# Patient Record
Sex: Male | Born: 1965 | Race: White | Hispanic: No | Marital: Married | Smoking: Never smoker
Health system: Southern US, Community
[De-identification: ages and names within clinical notes are randomized; demographics above are authoritative.]

## PROBLEM LIST (undated history)

## (undated) DIAGNOSIS — M1990 Unspecified osteoarthritis, unspecified site: Secondary | ICD-10-CM

## (undated) DIAGNOSIS — G4730 Sleep apnea, unspecified: Secondary | ICD-10-CM

## (undated) DIAGNOSIS — F329 Major depressive disorder, single episode, unspecified: Secondary | ICD-10-CM

## (undated) DIAGNOSIS — F419 Anxiety disorder, unspecified: Secondary | ICD-10-CM

## (undated) DIAGNOSIS — F32A Depression, unspecified: Secondary | ICD-10-CM

## (undated) DIAGNOSIS — I10 Essential (primary) hypertension: Secondary | ICD-10-CM

---
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
## 2003-08-13 HISTORY — PX: SHOULDER ARTHROSCOPY: SHX128

## 2013-08-12 HISTORY — PX: COLONOSCOPY: SHX174

## 2014-08-12 HISTORY — PX: JOINT REPLACEMENT: SHX530

## 2015-08-13 HISTORY — PX: KNEE ARTHROSCOPY WITH PATELLAR TENDON REPAIR: SHX5656

## 2019-01-21 NOTE — Patient Instructions (Addendum)
Cole Gill    Your procedure is scheduled on: 01-26-2019  Report to [facility] Main  Entrance  Report to admitting at 900 AM   [place] [facility] TEST ON__today _____ @___12 :30____, THIS TEST MUST BE DONE BEFORE SURGERY, COME TO [place].    Call this number if you have problems the morning of surgery [phone]    Remember: . BRUSH YOUR TEETH MORNING OF SURGERY AND RINSE YOUR MOUTH OUT, NO CHEWING GUM CANDY OR MINTS.   NO SOLID FOOD AFTER MIDNIGHT THE NIGHT PRIOR TO SURGERY. NOTHING BY MOUTH EXCEPT CLEAR LIQUIDS UNTIL 430 AM.. PLEASE FINISH ENSURE DRINK PER SURGEON ORDER  WHICH NEEDS TO BE COMPLETED AT 430 AM.   CLEAR LIQUID DIET   Foods Allowed                                                                     Foods Excluded  Coffee and tea, regular and decaf                             liquids that you cannot  Plain Jell-O in any flavor                                             see through such as: Fruit ices (not with fruit pulp)                                     milk, soups, orange juice  Iced Popsicles                                    All solid food Carbonated beverages, regular and diet                                    Cranberry, grape and apple juices Sports drinks like Gatorade Lightly seasoned clear broth or consume(fat free) Sugar, honey syrup  Sample Menu Breakfast                                Lunch                                     Supper Cranberry juice                    Beef broth                            Chicken broth Jell-O  Grape juice                           Apple juice Coffee or tea                        Jell-O                                      Popsicle                                                Coffee or tea                        Coffee or  tea  _____________________________________________________________________    Take these medicines the morning of surgery with A SIP OF WATER:  DO NOT TAKE ANY DIABETIC MEDICATIONS DAY OF YOUR SURGERY                               You may not have any metal on your body including hair pins and              piercings  Do not wear jewelry, make-up, lotions, powders or perfumes, deodorant             Do not wear nail polish.  Do not shave  48 hours prior to surgery.              Men may shave face and neck.   Do not bring valuables to the hospital. [facility] IS NOT             RESPONSIBLE   FOR VALUABLES.  Contacts, dentures or bridgework may not be worn into surgery.  Leave suitcase in the car. After surgery it may be brought to your room.                  Please read over the following fact sheets you were given: _____________________________________________________________________             [facility] Before surgery, you can play an important role.  Because skin is not sterile, your skin needs to be as free of germs as possible.  You can reduce the number of germs on your skin by washing with CHG (chlorahexidine gluconate) soap before surgery.  CHG is an antiseptic cleaner which kills germs and bonds with the skin to continue killing germs even after washing. Please DO NOT use if you have an allergy to CHG or antibacterial soaps.  If your skin becomes reddened/irritated stop using the CHG and inform your nurse when you arrive at Short Stay. Do not shave (including legs and underarms) for at least 48 hours prior to the first CHG shower.  You may shave your face/neck. Please follow these instructions carefully:  1.  Shower with CHG Soap the night before surgery and the  morning of Surgery.  2.  If you choose to wash your hair, wash your hair first as usual with your  normal  shampoo.  3.  After you shampoo, rinse your hair and body thoroughly to remove the  shampoo.                           4.  Use CHG as you would any other liquid soap.  You can apply chg directly  to the skin and wash                       Gently with a scrungie or clean washcloth.  5.  Apply the CHG Soap to your body ONLY FROM THE NECK DOWN.   Do not use on face/ open                           Wound or open sores. Avoid contact with eyes, ears mouth and genitals (private parts).                       Wash face,  Genitals (private parts) with your normal soap.             6.  Wash thoroughly, paying special attention to the area where your surgery  will be performed.  7.  Thoroughly rinse your body with warm water from the neck down.  8.  DO NOT shower/wash with your normal soap after using and rinsing off  the CHG Soap.                9.  Pat yourself dry with a clean towel.            10.  Wear clean pajamas.            11.  Place clean sheets on your bed the night of your first shower and do not  sleep with pets. Day of Surgery : Do not apply any lotions/deodorants the morning of surgery.  Please wear clean clothes to the hospital/surgery center.  FAILURE TO FOLLOW THESE INSTRUCTIONS MAY RESULT IN THE CANCELLATION OF YOUR SURGERY PATIENT SIGNATURE_________________________________  NURSE SIGNATURE__________________________________  ________________________________________________________________________   Adam Phenix  An incentive spirometer is a tool that can help keep your lungs clear and active. This tool measures how well you are filling your lungs with each breath. Taking long deep breaths may help reverse or decrease the chance of developing breathing (pulmonary) problems (especially infection) following:  A long period of time when you are unable to move or be active. BEFORE THE PROCEDURE   If the spirometer includes an indicator to show your best effort, your nurse or respiratory therapist will set it to a desired goal.  If possible, sit up straight  or lean slightly forward. Try not to slouch.  Hold the incentive spirometer in an upright position. INSTRUCTIONS FOR USE  1. Sit on the edge of your bed if possible, or sit up as far as you can in bed or on a chair. 2. Hold the incentive spirometer in an upright position. 3. Breathe out normally. 4. Place the mouthpiece in your mouth and seal your lips tightly around it. 5. Breathe in slowly and as deeply as possible, raising the piston or the ball toward the top of the column. 6. Hold your breath for 3-5 seconds or for as long as possible. Allow the piston or ball to fall to the bottom of the column. 7. Remove the mouthpiece from your mouth and breathe out normally. 8. Rest for a few seconds and repeat Steps 1 through 7 at least 10 times every  1-2 hours when you are awake. Take your time and take a few normal breaths between deep breaths. 9. The spirometer may include an indicator to show your best effort. Use the indicator as a goal to work toward during each repetition. 10. After each set of 10 deep breaths, practice coughing to be sure your lungs are clear. If you have an incision (the cut made at the time of surgery), support your incision when coughing by placing a pillow or rolled up towels firmly against it. Once you are able to get out of bed, walk around indoors and cough well. You may stop using the incentive spirometer when instructed by your caregiver.  RISKS AND COMPLICATIONS  Take your time so you do not get dizzy or light-headed.  If you are in pain, you may need to take or ask for pain medication before doing incentive spirometry. It is harder to take a deep breath if you are having pain. AFTER USE  Rest and breathe slowly and easily.  It can be helpful to keep track of a log of your progress. Your caregiver can provide you with a simple table to help with this. If you are using the spirometer at home, follow these instructions: [place] IF:   You are having  difficultly using the spirometer.  You have trouble using the spirometer as often as instructed.  Your pain medication is not giving enough relief while using the spirometer.  You develop fever of 100.5 F (38.1 C) or higher. SEEK IMMEDIATE MEDICAL CARE IF:   You cough up bloody sputum that had not been present before.  You develop fever of 102 F (38.9 C) or greater.  You develop worsening pain at or near the incision site. MAKE SURE YOU:   Understand these instructions.  Will watch your condition.  Will get help right away if you are not doing well or get worse. Document Released: 12/09/2006 Document Revised: 10/21/2011 Document Reviewed: 02/09/2007 ExitCare Patient Information 2014 ExitCare, [place].   ________________________________________________________________________  WHAT IS A BLOOD TRANSFUSION? Blood Transfusion Information  A transfusion is the replacement of blood or some of its parts. Blood is made up of multiple cells which provide different functions.  Red blood cells carry oxygen and are used for blood loss replacement.  White blood cells fight against infection.  Platelets control bleeding.  Plasma helps clot blood.  Other blood products are available for specialized needs, such as hemophilia or other clotting disorders. BEFORE THE TRANSFUSION  Who gives blood for transfusions?   Healthy volunteers who are fully evaluated to make sure their blood is safe. This is blood bank blood. Transfusion therapy is the safest it has ever been in the practice of medicine. Before blood is taken from a donor, a complete history is taken to make sure that person has no history of diseases nor engages in risky social behavior (examples are intravenous drug use or sexual activity with multiple partners). The donor's travel history is screened to minimize risk of transmitting infections, such as malaria. The donated blood is tested for signs of infectious diseases, such as  HIV and hepatitis. The blood is then tested to be sure it is compatible with you in order to minimize the chance of a transfusion reaction. If you or a relative donates blood, this is often done in anticipation of surgery and is not appropriate for emergency situations. It takes many days to process the donated blood. RISKS AND COMPLICATIONS Although transfusion therapy is very safe  and saves many lives, the main dangers of transfusion include:   Getting an infectious disease.  Developing a transfusion reaction. This is an allergic reaction to something in the blood you were given. Every precaution is taken to prevent this. The decision to have a blood transfusion has been considered carefully by your caregiver before blood is given. Blood is not given unless the benefits outweigh the risks. AFTER THE TRANSFUSION  Right after receiving a blood transfusion, you will usually feel much better and more energetic. This is especially true if your red blood cells have gotten low (anemic). The transfusion raises the level of the red blood cells which carry oxygen, and this usually causes an energy increase.  The nurse administering the transfusion will monitor you carefully for complications. HOME CARE INSTRUCTIONS  No special instructions are needed after a transfusion. You may find your energy is better. Speak with your caregiver about any limitations on activity for underlying diseases you may have. SEEK MEDICAL CARE IF:   Your condition is not improving after your transfusion.  You develop redness or irritation at the intravenous (IV) site. SEEK IMMEDIATE MEDICAL CARE IF:  Any of the following symptoms occur over the next 12 hours:  Shaking chills.  You have a temperature by mouth above 102 F (38.9 C), not controlled by medicine.  Chest, back, or muscle pain.  People around you feel you are not acting correctly or are confused.  Shortness of breath or difficulty breathing.  Dizziness  and fainting.  You get a rash or develop hives.  You have a decrease in urine output.  Your urine turns a dark color or changes to pink, red, or brown. Any of the following symptoms occur over the next 10 days:  You have a temperature by mouth above 102 F (38.9 C), not controlled by medicine.  Shortness of breath.  Weakness after normal activity.  The white part of the eye turns yellow (jaundice).  You have a decrease in the amount of urine or are urinating less often.  Your urine turns a dark color or changes to pink, red, or brown. Document Released: 07/26/2000 Document Revised: 10/21/2011 Document Reviewed: 03/14/2008 [facility] Patient Information 2014 [place], [place].  _______________________________________________________________________

## 2019-01-22 ENCOUNTER — Encounter (HOSPITAL_COMMUNITY)
Admission: RE | Admit: 2019-01-22 | Discharge: 2019-01-22 | Disposition: A | Discharge status: Home / Self Care | Payer: BC Managed Care – PPO | Source: Ambulatory Visit | Attending: Orthopedic Surgery | Admitting: Orthopedic Surgery

## 2019-01-22 ENCOUNTER — Encounter (HOSPITAL_COMMUNITY): Payer: Self-pay

## 2019-01-22 ENCOUNTER — Other Ambulatory Visit: Payer: Self-pay

## 2019-01-22 ENCOUNTER — Other Ambulatory Visit (HOSPITAL_COMMUNITY)
Admission: RE | Admit: 2019-01-22 | Discharge: 2019-01-22 | Disposition: A | Discharge status: Home / Self Care | Payer: BC Managed Care – PPO | Source: Ambulatory Visit | Attending: Orthopedic Surgery | Admitting: Orthopedic Surgery

## 2019-01-22 DIAGNOSIS — Z1159 Encounter for screening for other viral diseases: Secondary | ICD-10-CM | POA: Insufficient documentation

## 2019-01-22 DIAGNOSIS — M1612 Unilateral primary osteoarthritis, left hip: Secondary | ICD-10-CM | POA: Insufficient documentation

## 2019-01-22 DIAGNOSIS — I517 Cardiomegaly: Secondary | ICD-10-CM | POA: Diagnosis not present

## 2019-01-22 DIAGNOSIS — Z01818 Encounter for other preprocedural examination: Secondary | ICD-10-CM | POA: Diagnosis present

## 2019-01-22 HISTORY — DX: Sleep apnea, unspecified: G47.30

## 2019-01-22 HISTORY — DX: Depression, unspecified: F32.A

## 2019-01-22 HISTORY — DX: Essential (primary) hypertension: I10

## 2019-01-22 HISTORY — DX: Anxiety disorder, unspecified: F41.9

## 2019-01-22 HISTORY — DX: Unspecified osteoarthritis, unspecified site: M19.90

## 2019-01-22 LAB — CBC
HCT: 54.3 % — ABNORMAL HIGH (ref 39.0–52.0)
Hemoglobin: 18.3 g/dL — ABNORMAL HIGH (ref 13.0–17.0)
MCH: 32.2 pg (ref 26.0–34.0)
MCHC: 33.7 g/dL (ref 30.0–36.0)
MCV: 95.4 fL (ref 80.0–100.0)
Platelets: 214 10*3/uL (ref 150–400)
RBC: 5.69 MIL/uL (ref 4.22–5.81)
RDW: 12.7 % (ref 11.5–15.5)
WBC: 7 10*3/uL (ref 4.0–10.5)
nRBC: 0 % (ref 0.0–0.2)

## 2019-01-22 LAB — BASIC METABOLIC PANEL
Anion gap: 9 (ref 5–15)
BUN: 19 mg/dL (ref 6–20)
CO2: 29 mmol/L (ref 22–32)
Calcium: 9.2 mg/dL (ref 8.9–10.3)
Chloride: 102 mmol/L (ref 98–111)
Creatinine, Ser: 1.06 mg/dL (ref 0.61–1.24)
GFR calc Af Amer: >60 mL/min (ref >60)
GFR calc non Af Amer: >60 mL/min (ref >60)
Glucose, Bld: 94 mg/dL (ref 70–99)
Potassium: 4 mmol/L (ref 3.5–5.1)
Sodium: 140 mmol/L (ref 135–145)

## 2019-01-22 LAB — SURGICAL PCR SCREEN
MRSA, PCR: POSITIVE — AB
Staphylococcus aureus: POSITIVE — AB

## 2019-01-22 LAB — ABO/RH: ABO/RH(D): O POS

## 2019-01-22 NOTE — Progress Notes (Signed)
Konrad Felix PA  Pt has stopped ASA 01/11/19 Per Dr. Alvan Dame. His PCP is with [facility] in [place] ,[place]

## 2019-01-23 LAB — NOVEL CORONAVIRUS, NAA (HOSP ORDER, SEND-OUT TO REF LAB; TAT 18-24 HRS): SARS-CoV-2, NAA: NOT DETECTED

## 2019-01-25 ENCOUNTER — Encounter (HOSPITAL_COMMUNITY): Payer: Self-pay | Admitting: *Deleted

## 2019-01-25 MED ORDER — VANCOMYCIN HCL 10 G IV SOLR
1500.0000 mg | INTRAVENOUS | Status: AC
  Administered 2019-01-26: 1500 mg via INTRAVENOUS
  Filled 2019-01-25: qty 1500

## 2019-01-25 NOTE — Progress Notes (Signed)
    SPOKE W/  Patient via phone     SCREENING SYMPTOMS OF COVID 19:   COUGH--no  RUNNY NOSE--- no  SORE THROAT---no  NASAL CONGESTION----no  SNEEZING----no  SHORTNESS OF BREATH---no  DIFFICULTY BREATHING---no  TEMP >100.0 -----no  UNEXPLAINED BODY ACHES------no  CHILLS -------- no  HEADACHES ---------no  LOSS OF SMELL/ TASTE --------no    HAVE YOU OR ANY FAMILY MEMBER TRAVELLED PAST 14 DAYS OUT OF THE   COUNTY---yes lives in [place]. Just between there and [place] STATE----no COUNTRY----no  HAVE YOU OR ANY FAMILY MEMBER BEEN EXPOSED TO ANYONE WITH COVID 19? no

## 2019-01-25 NOTE — H&P (Signed)
TOTAL HIP ADMISSION H&P  Patient is admitted for left total hip arthroplasty, anterior approach.  Subjective:  Chief Complaint:   Left hip primary OA / pain  HPI: Cole Gill, 53 y.o. male, has a history of pain and functional disability in the left hip(s) due to arthritis and patient has failed non-surgical conservative treatments for greater than 12 weeks to include NSAID's and/or analgesics and activity modification.  Onset of symptoms was gradual starting 1+ years ago with gradually worsening course since that time.The patient noted no past surgery on the left hip(s).  Patient currently rates pain in the left hip at 5 out of 10 with activity. Patient has night pain, worsening of pain with activity and weight bearing, trendelenberg gait, pain that interfers with activities of daily living and pain with passive range of motion. Patient has evidence of periarticular osteophytes and joint space narrowing by imaging studies. This condition presents safety issues increasing the risk of falls.   There is no current active infection.Risks, benefits and expectations were discussed with the patient.  Risks including but not limited to the risk of anesthesia, blood clots, nerve damage, blood vessel damage, failure of the prosthesis, infection and up to and including death.  Patient understand the risks, benefits and expectations and wishes to proceed with surgery.   PCP: System, Pcp Not In  D/C Plans:       Home   Post-op Meds:       No Rx given   Tranexamic Acid:      To be given - IV   Decadron:      Is to be given  FYI:      ASA  Norco  CPAP  DME:   Pt already has equipment  PT:   [facility]    Past Medical History:  Diagnosis Date  . Anxiety   . Arthritis    hips knees fingers  . Depression   . Hypertension    Dr. Georgina PillionMassey  . Sleep apnea    C-pap    Past Surgical History:  Procedure Laterality Date  . COLONOSCOPY  2015  . JOINT REPLACEMENT  2016  . KNEE ARTHROSCOPY WITH PATELLAR  TENDON REPAIR Right 2017  . SHOULDER ARTHROSCOPY Left 2005    Current Facility-Administered Medications  Medication Dose Route Frequency Provider Last Rate Last Dose  . [START ON 01/26/2019] vancomycin (VANCOCIN) 1,500 mg in sodium chloride 0.9 % 500 mL IVPB  1,500 mg Intravenous On Call to OR Durene Romanslin, Laloni Rowton, MD       Current Outpatient Medications  Medication Sig Dispense Refill Last Dose  . acetaminophen (TYLENOL) 500 MG tablet Take 1,000 mg by mouth every 6 (six) hours as needed for moderate pain.     [name]. amLODIPine-Valsartan-HCTZ 10-320-25 MG TABS Take 1 tablet by mouth daily.     [name]. aspirin 81 MG EC tablet Take 81 mg by mouth daily.     . celecoxib (CELEBREX) 200 MG capsule Take 200 mg by mouth daily.     . fluticasone (FLONASE) 50 MCG/ACT nasal spray Place 2 sprays into both nostrils 2 (two) times a day.     . Magnesium 400 MG TABS Take 800 mg by mouth at bedtime.     . Potassium 99 MG TABS Take 99 mg by mouth daily.     . tamsulosin (FLOMAX) 0.4 MG CAPS capsule Take 0.4 mg by mouth daily.     [name]. testosterone cypionate (DEPOTESTOSTERONE CYPIONATE) 200 MG/ML injection Inject 200 mg into the muscle  once a week.     . Vilazodone HCl (VIIBRYD) 20 MG TABS Take 20 mg by mouth daily.      No Known Allergies   Social History   Tobacco Use  . Smoking status: Never Smoker  . Smokeless tobacco: Former Systems developer    Types: Chew  Substance Use Topics  . Alcohol use: Not Currently    Frequency: Never    Comment: rare       Review of Systems  Constitutional: Negative.   HENT: Negative.   Eyes: Negative.   Respiratory: Negative.   Cardiovascular: Negative.   Gastrointestinal: Negative.   Genitourinary: Negative.   Musculoskeletal: Positive for joint pain.  Skin: Negative.   Neurological: Negative.   Endo/Heme/Allergies: Negative.   Psychiatric/Behavioral: Negative.     Objective:  Physical Exam  Constitutional: He is oriented to person, place, and time. He appears well-developed.   HENT:  Head: Normocephalic.  Eyes: Pupils are equal, round, and reactive to light.  Neck: Neck supple. No JVD present. No tracheal deviation present. No thyromegaly present.  Cardiovascular: Normal rate, regular rhythm and intact distal pulses.  Respiratory: Effort normal and breath sounds normal. No respiratory distress. He has no wheezes.  GI: Soft. There is no abdominal tenderness. There is no guarding.  Musculoskeletal:     Left hip: He exhibits decreased range of motion, decreased strength, tenderness and bony tenderness. He exhibits no swelling, no deformity and no laceration.  Lymphadenopathy:    He has no cervical adenopathy.  Neurological: He is alert and oriented to person, place, and time.  Skin: Skin is warm and dry.  Psychiatric: He has a normal mood and affect.     Labs:   Estimated body mass index is 33.19 kg/m as calculated from the following:   Height as of 01/22/19: 5\' 11"  (1.803 m).   Weight as of 01/22/19: 108 kg.   Imaging Review Plain radiographs demonstrate severe degenerative joint disease of the left hip(s). The bone quality appears to be good for age and reported activity level.      Assessment/Plan:  End stage arthritis, left hip(s)  The patient history, physical examination, clinical judgement of the provider and imaging studies are consistent with end stage degenerative joint disease of the left hip(s) and total hip arthroplasty is deemed medically necessary. The treatment options including medical management, injection therapy, arthroscopy and arthroplasty were discussed at length. The risks and benefits of total hip arthroplasty were presented and reviewed. The risks due to aseptic loosening, infection, stiffness, dislocation/subluxation,  thromboembolic complications and other imponderables were discussed.  The patient acknowledged the explanation, agreed to proceed with the plan and consent was signed. Patient is being admitted for inpatient  treatment for surgery, pain control, PT, OT, prophylactic antibiotics, VTE prophylaxis, progressive ambulation and ADL's and discharge planning.The patient is planning to be discharged home.    West Pugh Yola Paradiso   PA-C  01/25/2019, 11:02 AM

## 2019-01-26 ENCOUNTER — Inpatient Hospital Stay (HOSPITAL_COMMUNITY): Payer: BC Managed Care – PPO

## 2019-01-26 ENCOUNTER — Inpatient Hospital Stay (HOSPITAL_COMMUNITY): Payer: BC Managed Care – PPO | Admitting: Physician Assistant

## 2019-01-26 ENCOUNTER — Inpatient Hospital Stay (HOSPITAL_COMMUNITY)
Admission: RE | Admit: 2019-01-26 | Discharge: 2019-01-27 | DRG: 470 | Disposition: A | Discharge status: Home / Self Care | Payer: BC Managed Care – PPO | Attending: Orthopedic Surgery | Admitting: Orthopedic Surgery

## 2019-01-26 ENCOUNTER — Other Ambulatory Visit: Payer: Self-pay

## 2019-01-26 ENCOUNTER — Telehealth (HOSPITAL_COMMUNITY): Payer: Self-pay | Admitting: *Deleted

## 2019-01-26 ENCOUNTER — Encounter (HOSPITAL_COMMUNITY): Payer: Self-pay | Admitting: Anesthesiology

## 2019-01-26 ENCOUNTER — Inpatient Hospital Stay (HOSPITAL_COMMUNITY): Payer: BC Managed Care – PPO | Admitting: Anesthesiology

## 2019-01-26 ENCOUNTER — Observation Stay (HOSPITAL_COMMUNITY): Payer: BC Managed Care – PPO

## 2019-01-26 ENCOUNTER — Encounter (HOSPITAL_COMMUNITY)
Admission: RE | Disposition: A | Discharge status: Home / Self Care | Payer: Self-pay | Source: Home / Self Care | Attending: Orthopedic Surgery

## 2019-01-26 DIAGNOSIS — Z96649 Presence of unspecified artificial hip joint: Secondary | ICD-10-CM

## 2019-01-26 DIAGNOSIS — Z79899 Other long term (current) drug therapy: Secondary | ICD-10-CM

## 2019-01-26 DIAGNOSIS — M1612 Unilateral primary osteoarthritis, left hip: Principal | ICD-10-CM | POA: Diagnosis present

## 2019-01-26 DIAGNOSIS — I10 Essential (primary) hypertension: Secondary | ICD-10-CM | POA: Diagnosis present

## 2019-01-26 DIAGNOSIS — Z6833 Body mass index (BMI) 33.0-33.9, adult: Secondary | ICD-10-CM

## 2019-01-26 DIAGNOSIS — Z7982 Long term (current) use of aspirin: Secondary | ICD-10-CM

## 2019-01-26 DIAGNOSIS — G4730 Sleep apnea, unspecified: Secondary | ICD-10-CM | POA: Diagnosis present

## 2019-01-26 DIAGNOSIS — E669 Obesity, unspecified: Secondary | ICD-10-CM | POA: Diagnosis present

## 2019-01-26 DIAGNOSIS — M170 Bilateral primary osteoarthritis of knee: Secondary | ICD-10-CM | POA: Diagnosis present

## 2019-01-26 DIAGNOSIS — Z96642 Presence of left artificial hip joint: Secondary | ICD-10-CM

## 2019-01-26 DIAGNOSIS — M19049 Primary osteoarthritis, unspecified hand: Secondary | ICD-10-CM | POA: Diagnosis present

## 2019-01-26 DIAGNOSIS — Z419 Encounter for procedure for purposes other than remedying health state, unspecified: Secondary | ICD-10-CM

## 2019-01-26 HISTORY — PX: TOTAL HIP ARTHROPLASTY: SHX124

## 2019-01-26 LAB — TYPE AND SCREEN
ABO/RH(D): O POS
Antibody Screen: NEGATIVE

## 2019-01-26 SURGERY — ARTHROPLASTY, HIP, TOTAL, ANTERIOR APPROACH
Anesthesia: Spinal | Site: Hip | Laterality: Left

## 2019-01-26 MED ORDER — DEXAMETHASONE SODIUM PHOSPHATE 10 MG/ML IJ SOLN: 10.0000 mg | Freq: Once | INTRAMUSCULAR | Status: DC

## 2019-01-26 MED ORDER — PROPOFOL 10 MG/ML IV BOLUS
INTRAVENOUS | Status: AC
  Filled 2019-01-26: qty 20

## 2019-01-26 MED ORDER — PROPOFOL 10 MG/ML IV BOLUS
INTRAVENOUS | Status: AC
  Filled 2019-01-26: qty 60

## 2019-01-26 MED ORDER — MENTHOL 3 MG MT LOZG: 1.0000 | LOZENGE | OROMUCOSAL | Status: DC | PRN

## 2019-01-26 MED ORDER — EPHEDRINE 5 MG/ML INJ
INTRAVENOUS | Status: AC
  Filled 2019-01-26: qty 10

## 2019-01-26 MED ORDER — MEPERIDINE HCL 50 MG/ML IJ SOLN: 6.2500 mg | INTRAMUSCULAR | Status: DC | PRN

## 2019-01-26 MED ORDER — CEFAZOLIN SODIUM-DEXTROSE 2-4 GM/100ML-% IV SOLN
2.0000 g | INTRAVENOUS | Status: AC
  Administered 2019-01-26: 2 g via INTRAVENOUS
  Filled 2019-01-26: qty 100

## 2019-01-26 MED ORDER — HYDROCHLOROTHIAZIDE 25 MG PO TABS
25.0000 mg | ORAL_TABLET | Freq: Every day | ORAL | Status: DC
  Administered 2019-01-27: 25 mg via ORAL
  Filled 2019-01-26: qty 1

## 2019-01-26 MED ORDER — OXYCODONE HCL 5 MG/5ML PO SOLN: 5.0000 mg | Freq: Once | ORAL | Status: DC | PRN

## 2019-01-26 MED ORDER — ONDANSETRON HCL 4 MG/2ML IJ SOLN: 4.0000 mg | Freq: Once | INTRAMUSCULAR | Status: DC | PRN

## 2019-01-26 MED ORDER — HYDROCODONE-ACETAMINOPHEN 7.5-325 MG PO TABS: 1.0000 | ORAL_TABLET | ORAL | Status: DC | PRN

## 2019-01-26 MED ORDER — TAMSULOSIN HCL 0.4 MG PO CAPS
0.4000 mg | ORAL_CAPSULE | Freq: Every day | ORAL | Status: DC
  Administered 2019-01-27: 0.4 mg via ORAL
  Filled 2019-01-26: qty 1

## 2019-01-26 MED ORDER — CEFAZOLIN SODIUM-DEXTROSE 2-4 GM/100ML-% IV SOLN
2.0000 g | Freq: Four times a day (QID) | INTRAVENOUS | Status: AC
  Administered 2019-01-26 – 2019-01-27 (×2): 2 g via INTRAVENOUS
  Filled 2019-01-26 (×2): qty 100

## 2019-01-26 MED ORDER — HYDROMORPHONE HCL 1 MG/ML IJ SOLN: 0.5000 mg | INTRAMUSCULAR | Status: DC | PRN

## 2019-01-26 MED ORDER — PHENOL 1.4 % MT LIQD: 1.0000 | OROMUCOSAL | Status: DC | PRN

## 2019-01-26 MED ORDER — ASPIRIN 81 MG PO CHEW
81.0000 mg | CHEWABLE_TABLET | Freq: Two times a day (BID) | ORAL | Status: DC
  Administered 2019-01-26 – 2019-01-27 (×2): 81 mg via ORAL
  Filled 2019-01-26 (×2): qty 1

## 2019-01-26 MED ORDER — MIDAZOLAM HCL 5 MG/5ML IJ SOLN
INTRAMUSCULAR | Status: DC | PRN
  Administered 2019-01-26: 2 mg via INTRAVENOUS

## 2019-01-26 MED ORDER — AMLODIPINE BESYLATE 10 MG PO TABS
10.0000 mg | ORAL_TABLET | Freq: Every day | ORAL | Status: DC
  Administered 2019-01-27: 10 mg via ORAL
  Filled 2019-01-26: qty 1

## 2019-01-26 MED ORDER — BUPIVACAINE IN DEXTROSE 0.75-8.25 % IT SOLN
INTRATHECAL | Status: DC | PRN
  Administered 2019-01-26: 2 mL via INTRATHECAL

## 2019-01-26 MED ORDER — FLUTICASONE PROPIONATE 50 MCG/ACT NA SUSP
2.0000 | Freq: Two times a day (BID) | NASAL | Status: DC
  Filled 2019-01-26: qty 16

## 2019-01-26 MED ORDER — METHOCARBAMOL 500 MG IVPB - SIMPLE MED
500.0000 mg | Freq: Four times a day (QID) | INTRAVENOUS | Status: DC | PRN
  Filled 2019-01-26: qty 50

## 2019-01-26 MED ORDER — SODIUM CHLORIDE 0.9 % IV SOLN
INTRAVENOUS | Status: DC
  Administered 2019-01-26 – 2019-01-27 (×2): via INTRAVENOUS

## 2019-01-26 MED ORDER — EPHEDRINE SULFATE-NACL 50-0.9 MG/10ML-% IV SOSY
PREFILLED_SYRINGE | INTRAVENOUS | Status: DC | PRN
  Administered 2019-01-26 (×2): 10 mg via INTRAVENOUS

## 2019-01-26 MED ORDER — DOCUSATE SODIUM 100 MG PO CAPS
100.0000 mg | ORAL_CAPSULE | Freq: Two times a day (BID) | ORAL | Status: DC
  Administered 2019-01-26 – 2019-01-27 (×2): 100 mg via ORAL
  Filled 2019-01-26 (×2): qty 1

## 2019-01-26 MED ORDER — POLYETHYLENE GLYCOL 3350 17 G PO PACK
17.0000 g | PACK | Freq: Two times a day (BID) | ORAL | Status: DC
  Administered 2019-01-26 – 2019-01-27 (×2): 17 g via ORAL
  Filled 2019-01-26 (×2): qty 1

## 2019-01-26 MED ORDER — FENTANYL CITRATE (PF) 100 MCG/2ML IJ SOLN
INTRAMUSCULAR | Status: DC | PRN
  Administered 2019-01-26: 25 ug via INTRAVENOUS
  Administered 2019-01-26: 75 ug via INTRAVENOUS

## 2019-01-26 MED ORDER — METOCLOPRAMIDE HCL 5 MG/ML IJ SOLN: 5.0000 mg | Freq: Three times a day (TID) | INTRAMUSCULAR | Status: DC | PRN

## 2019-01-26 MED ORDER — BISACODYL 10 MG RE SUPP: 10.0000 mg | Freq: Every day | RECTAL | Status: DC | PRN

## 2019-01-26 MED ORDER — PROPOFOL 500 MG/50ML IV EMUL
INTRAVENOUS | Status: DC | PRN
  Administered 2019-01-26: 100 ug/kg/min via INTRAVENOUS

## 2019-01-26 MED ORDER — TRANEXAMIC ACID-NACL 1000-0.7 MG/100ML-% IV SOLN
1000.0000 mg | Freq: Once | INTRAVENOUS | Status: AC
  Administered 2019-01-26: 1000 mg via INTRAVENOUS
  Filled 2019-01-26: qty 100

## 2019-01-26 MED ORDER — MAGNESIUM CITRATE PO SOLN: 1.0000 | Freq: Once | ORAL | Status: DC | PRN

## 2019-01-26 MED ORDER — DIPHENHYDRAMINE HCL 12.5 MG/5ML PO ELIX: 12.5000 mg | ORAL_SOLUTION | ORAL | Status: DC | PRN

## 2019-01-26 MED ORDER — ALUM & MAG HYDROXIDE-SIMETH 200-200-20 MG/5ML PO SUSP: 15.0000 mL | ORAL | Status: DC | PRN

## 2019-01-26 MED ORDER — ALBUMIN HUMAN 5 % IV SOLN
INTRAVENOUS | Status: AC
  Filled 2019-01-26: qty 500

## 2019-01-26 MED ORDER — LACTATED RINGERS IV SOLN
INTRAVENOUS | Status: DC
  Administered 2019-01-26 (×3): via INTRAVENOUS

## 2019-01-26 MED ORDER — ONDANSETRON HCL 4 MG PO TABS: 4.0000 mg | ORAL_TABLET | Freq: Four times a day (QID) | ORAL | Status: DC | PRN

## 2019-01-26 MED ORDER — FENTANYL CITRATE (PF) 100 MCG/2ML IJ SOLN
INTRAMUSCULAR | Status: AC
  Filled 2019-01-26: qty 2

## 2019-01-26 MED ORDER — MIDAZOLAM HCL 2 MG/2ML IJ SOLN
INTRAMUSCULAR | Status: AC
  Filled 2019-01-26: qty 2

## 2019-01-26 MED ORDER — TRANEXAMIC ACID-NACL 1000-0.7 MG/100ML-% IV SOLN
1000.0000 mg | INTRAVENOUS | Status: AC
  Administered 2019-01-26: 1000 mg via INTRAVENOUS
  Filled 2019-01-26: qty 100

## 2019-01-26 MED ORDER — ONDANSETRON HCL 4 MG/2ML IJ SOLN: 4.0000 mg | Freq: Four times a day (QID) | INTRAMUSCULAR | Status: DC | PRN

## 2019-01-26 MED ORDER — ONDANSETRON HCL 4 MG/2ML IJ SOLN
INTRAMUSCULAR | Status: DC | PRN
  Administered 2019-01-26: 4 mg via INTRAVENOUS

## 2019-01-26 MED ORDER — ALBUMIN HUMAN 5 % IV SOLN
INTRAVENOUS | Status: DC | PRN
  Administered 2019-01-26 (×2): via INTRAVENOUS

## 2019-01-26 MED ORDER — ACETAMINOPHEN 160 MG/5ML PO SOLN: 325.0000 mg | ORAL | Status: DC | PRN

## 2019-01-26 MED ORDER — DEXAMETHASONE SODIUM PHOSPHATE 10 MG/ML IJ SOLN
10.0000 mg | Freq: Once | INTRAMUSCULAR | Status: AC
  Administered 2019-01-26: 10 mg via INTRAVENOUS

## 2019-01-26 MED ORDER — PHENYLEPHRINE HCL (PRESSORS) 10 MG/ML IV SOLN
INTRAVENOUS | Status: AC
  Filled 2019-01-26: qty 1

## 2019-01-26 MED ORDER — PHENYLEPHRINE 40 MCG/ML (10ML) SYRINGE FOR IV PUSH (FOR BLOOD PRESSURE SUPPORT)
PREFILLED_SYRINGE | INTRAVENOUS | Status: DC | PRN
  Administered 2019-01-26: 80 ug via INTRAVENOUS

## 2019-01-26 MED ORDER — METHOCARBAMOL 500 MG PO TABS
500.0000 mg | ORAL_TABLET | Freq: Four times a day (QID) | ORAL | Status: DC | PRN
  Administered 2019-01-26 – 2019-01-27 (×2): 500 mg via ORAL
  Filled 2019-01-26 (×2): qty 1

## 2019-01-26 MED ORDER — PROPOFOL 10 MG/ML IV BOLUS
INTRAVENOUS | Status: DC | PRN
  Administered 2019-01-26 (×2): 10 mg via INTRAVENOUS

## 2019-01-26 MED ORDER — ACETAMINOPHEN 325 MG PO TABS: 325.0000 mg | ORAL_TABLET | Freq: Four times a day (QID) | ORAL | Status: DC | PRN

## 2019-01-26 MED ORDER — CHLORHEXIDINE GLUCONATE 4 % EX LIQD
60.0000 mL | Freq: Once | CUTANEOUS | Status: AC
  Administered 2019-01-26: 4 via TOPICAL

## 2019-01-26 MED ORDER — AMLODIPINE-VALSARTAN-HCTZ 10-320-25 MG PO TABS: 1.0000 | ORAL_TABLET | Freq: Every day | ORAL | Status: DC

## 2019-01-26 MED ORDER — FENTANYL CITRATE (PF) 100 MCG/2ML IJ SOLN: 25.0000 ug | INTRAMUSCULAR | Status: DC | PRN

## 2019-01-26 MED ORDER — CELECOXIB 200 MG PO CAPS
200.0000 mg | ORAL_CAPSULE | Freq: Two times a day (BID) | ORAL | Status: DC
  Administered 2019-01-26 – 2019-01-27 (×2): 200 mg via ORAL
  Filled 2019-01-26 (×2): qty 1

## 2019-01-26 MED ORDER — VILAZODONE HCL 20 MG PO TABS
20.0000 mg | ORAL_TABLET | Freq: Every day | ORAL | Status: DC
  Filled 2019-01-26: qty 1

## 2019-01-26 MED ORDER — OXYCODONE HCL 5 MG PO TABS: 5.0000 mg | ORAL_TABLET | Freq: Once | ORAL | Status: DC | PRN

## 2019-01-26 MED ORDER — FERROUS SULFATE 325 (65 FE) MG PO TABS
325.0000 mg | ORAL_TABLET | Freq: Three times a day (TID) | ORAL | Status: DC
  Administered 2019-01-26 – 2019-01-27 (×2): 325 mg via ORAL
  Filled 2019-01-26 (×2): qty 1

## 2019-01-26 MED ORDER — METOCLOPRAMIDE HCL 5 MG PO TABS: 5.0000 mg | ORAL_TABLET | Freq: Three times a day (TID) | ORAL | Status: DC | PRN

## 2019-01-26 MED ORDER — IRBESARTAN 150 MG PO TABS
300.0000 mg | ORAL_TABLET | Freq: Every day | ORAL | Status: DC
  Administered 2019-01-27: 300 mg via ORAL
  Filled 2019-01-26: qty 2

## 2019-01-26 MED ORDER — HYDROCODONE-ACETAMINOPHEN 5-325 MG PO TABS
1.0000 | ORAL_TABLET | ORAL | Status: DC | PRN
  Administered 2019-01-26 (×2): 1 via ORAL
  Administered 2019-01-27 (×3): 2 via ORAL
  Filled 2019-01-26: qty 2
  Filled 2019-01-26 (×2): qty 1
  Filled 2019-01-26 (×2): qty 2

## 2019-01-26 MED ORDER — ACETAMINOPHEN 325 MG PO TABS: 325.0000 mg | ORAL_TABLET | ORAL | Status: DC | PRN

## 2019-01-26 SURGICAL SUPPLY — 37 items
BLADE SAG 18X100X1.27 (BLADE) ×3 IMPLANT
COVER PERINEAL POST (MISCELLANEOUS) ×3 IMPLANT
COVER SURGICAL LIGHT HANDLE (MISCELLANEOUS) ×3 IMPLANT
COVER WAND RF STERILE (DRAPES) IMPLANT
CUP ACET PINNACLE SECTR 56MM (Hips) IMPLANT
DERMABOND ADVANCED (GAUZE/BANDAGES/DRESSINGS) ×2
DERMABOND ADVANCED .7 DNX12 (GAUZE/BANDAGES/DRESSINGS) ×1 IMPLANT
DRAPE STERI IOBAN 125X83 (DRAPES) ×3 IMPLANT
DRAPE U-SHAPE 47X51 STRL (DRAPES) ×6 IMPLANT
DRESSING AQUACEL AG SP 3.5X10 (GAUZE/BANDAGES/DRESSINGS) ×1 IMPLANT
DRSG AQUACEL AG SP 3.5X10 (GAUZE/BANDAGES/DRESSINGS) ×3
DURAPREP 26ML APPLICATOR (WOUND CARE) ×3 IMPLANT
ELECT REM PT RETURN 15FT ADLT (MISCELLANEOUS) ×3 IMPLANT
ELIMINATOR HOLE APEX DEPUY (Hips) ×2 IMPLANT
GLOVE BIOGEL PI IND STRL 8.5 (GLOVE) ×1 IMPLANT
GLOVE BIOGEL PI INDICATOR 8.5 (GLOVE) ×2
GLOVE ECLIPSE 8.0 STRL XLNG CF (GLOVE) ×6 IMPLANT
GLOVE ORTHO TXT STRL SZ7.5 (GLOVE) ×6 IMPLANT
GOWN STRL REUS W/TWL LRG LVL3 (GOWN DISPOSABLE) ×6 IMPLANT
GOWN STRL REUS W/TWL XL LVL3 (GOWN DISPOSABLE) ×3 IMPLANT
HEAD CERAMIC DELTA 36 PLUS 1.5 (Hips) ×2 IMPLANT
HOLDER FOLEY CATH W/STRAP (MISCELLANEOUS) ×3 IMPLANT
KIT TURNOVER KIT A (KITS) IMPLANT
PACK ANTERIOR HIP CUSTOM (KITS) ×3 IMPLANT
PINNACLE ALTRX PLUS 4 N 36X56 (Hips) ×2 IMPLANT
PINNACLE SECTOR CUP 56MM (Hips) ×3 IMPLANT
SCREW 6.5MMX30MM (Screw) ×2 IMPLANT
STEM FEM ACTIS HIGH SZ10 (Stem) ×2 IMPLANT
SUT MNCRL AB 4-0 PS2 18 (SUTURE) ×3 IMPLANT
SUT STRATAFIX 0 PDS 27 VIOLET (SUTURE) ×3
SUT VIC AB 1 CT1 36 (SUTURE) ×9 IMPLANT
SUT VIC AB 2-0 CT1 27 (SUTURE) ×4
SUT VIC AB 2-0 CT1 TAPERPNT 27 (SUTURE) ×2 IMPLANT
SUTURE STRATFX 0 PDS 27 VIOLET (SUTURE) ×1 IMPLANT
TRAY FOLEY MTR SLVR 16FR STAT (SET/KITS/TRAYS/PACK) ×2 IMPLANT
WATER STERILE IRR 1000ML POUR (IV SOLUTION) ×5 IMPLANT
YANKAUER SUCT BULB TIP 10FT TU (MISCELLANEOUS) ×2 IMPLANT

## 2019-01-26 NOTE — Discharge Instructions (Signed)

## 2019-01-26 NOTE — Transfer of Care (Signed)
Immediate Anesthesia Transfer of Care Note  Patient: Cole Gill  Procedure(s) Performed: TOTAL HIP ARTHROPLASTY ANTERIOR APPROACH (Left Hip)  Patient Location: PACU  Anesthesia Type:MAC and Spinal  Level of Consciousness: awake, alert  and patient cooperative  Airway & Oxygen Therapy: Patient Spontanous Breathing and Patient connected to face mask oxygen  Post-op Assessment: Report given to RN and Post -op Vital signs reviewed and stable  Post vital signs: Reviewed and stable  Last Vitals:  Vitals Value Taken Time  BP    Temp    Pulse    Resp    SpO2      Last Pain:  Vitals:   01/26/19 1309  TempSrc:   PainSc: 0-No pain      Patients Stated Pain Goal: 3 (94/07/68 0881)  Complications: No apparent anesthesia complications

## 2019-01-26 NOTE — Anesthesia Preprocedure Evaluation (Signed)
Anesthesia Evaluation  Patient identified by MRN, date of birth, ID band Patient awake    Reviewed: Allergy & Precautions, H&P , NPO status , Patient's Chart, lab work & pertinent test results, reviewed documented beta blocker date and time   Airway Mallampati: II  TM Distance: >3 FB Neck ROM: full    Dental no notable dental hx.    Pulmonary neg pulmonary ROS,    Pulmonary exam normal breath sounds clear to auscultation       Cardiovascular Exercise Tolerance: Good hypertension, Pt. on medications negative cardio ROS   Rhythm:regular Rate:Normal     Neuro/Psych negative neurological ROS  negative psych ROS   GI/Hepatic negative GI ROS, Neg liver ROS,   Endo/Other  negative endocrine ROS  Renal/GU negative Renal ROS  negative genitourinary   Musculoskeletal   Abdominal   Peds  Hematology negative hematology ROS (+)   Anesthesia Other Findings   Reproductive/Obstetrics negative OB ROS                             Anesthesia Physical Anesthesia Plan  ASA: III  Anesthesia Plan: Spinal   Post-op Pain Management:    Induction:   PONV Risk Score and Plan: 1  Airway Management Planned:   Additional Equipment:   Intra-op Plan:   Post-operative Plan:   Informed Consent: I have reviewed the patients History and Physical, chart, labs and discussed the procedure including the risks, benefits and alternatives for the proposed anesthesia with the patient or authorized representative who has indicated his/her understanding and acceptance.     Dental Advisory Given  Plan Discussed with: CRNA, Anesthesiologist and Surgeon  Anesthesia Plan Comments: (  )        Anesthesia Quick Evaluation

## 2019-01-26 NOTE — Op Note (Signed)
NAME:  Cole Gill                ACCOUNT NO.: [id number]      MEDICAL RECORD NO.: [id number]      FACILITY:  [facility]      PHYSICIAN:  [name]  DATE OF BIRTH:  1966-04-10     DATE OF PROCEDURE:  01/26/2019                                 OPERATIVE REPORT         PREOPERATIVE DIAGNOSIS: Left  hip osteoarthritis.      POSTOPERATIVE DIAGNOSIS:  Left hip osteoarthritis.      PROCEDURE:  Left total hip replacement through an anterior approach   utilizing DePuy THR system, component size 73mm pinnacle cup, a size 36+4 neutral   Altrex liner, a size 10 Hi Actis stem with a 36+1.5 delta ceramic   ball.      SURGEON:  Pietro Cassis. Alvan Dame, M.D.      ASSISTANT:  Danae Orleans, PA-C     ANESTHESIA:  Spinal.      SPECIMENS:  None.      COMPLICATIONS:  None.      BLOOD LOSS:  1000 cc     DRAINS:  None.      INDICATION OF THE PROCEDURE:  Cole Gill is a 53 y.o. male who had   presented to office for evaluation of left hip pain.  Radiographs revealed   progressive degenerative changes with bone-on-bone   articulation of the  hip joint, including subchondral cystic changes and osteophytes.  The patient had painful limited range of   motion significantly affecting their overall quality of life and function.  The patient was failing to    respond to conservative measures including medications and/or injections and activity modification and at this point was ready   to proceed with more definitive measures.  Consent was obtained for   benefit of pain relief.  Specific risks of infection, DVT, component   failure, dislocation, neurovascular injury, and need for revision surgery were reviewed in the office as well discussion of   the anterior versus posterior approach were reviewed.     PROCEDURE IN DETAIL:  The patient was brought to operative theater.   Once adequate anesthesia, preoperative antibiotics, 2 gm of Ancef, 1 gm of Tranexamic Acid, and 10 mg of  Decadron were administered, the patient was positioned supine on the [employer] table.  Once the patient was safely positioned with adequate padding of boney prominences we predraped out the hip, and used fluoroscopy to confirm orientation of the pelvis.      The left hip was then prepped and draped from proximal iliac crest to   mid thigh with a shower curtain technique.      Time-out was performed identifying the patient, planned procedure, and the appropriate extremity.     An incision was then made 2 cm lateral to the   anterior superior iliac spine extending over the orientation of the   tensor fascia lata muscle and sharp dissection was carried down to the   fascia of the muscle.      The fascia was then incised.  The muscle belly was identified and swept   laterally and retractor placed along the superior neck.  Following   cauterization of the circumflex vessels and removing some pericapsular  fat, a second cobra retractor was placed on the inferior neck.  A T-capsulotomy was made along the line of the   superior neck to the trochanteric fossa, then extended proximally and   distally.  Tag sutures were placed and the retractors were then placed   intracapsular.  We then identified the trochanteric fossa and   orientation of my neck cut and then made a neck osteotomy with the femur on traction.  The femoral   head was removed without difficulty or complication.  Traction was let   off and retractors were placed posterior and anterior around the   acetabulum.      The labrum and foveal tissue were debrided.  I began reaming with a 47 mm   reamer and reamed up to 55 mm reamer with good bony bed preparation and a 56 mm  cup was chosen.  The final 56 mm Pinnacle cup was then impacted under fluoroscopy to confirm the depth of penetration and orientation with respect to   Abduction and forward flexion.  A screw was placed into the ilium followed by the hole eliminator.  The final   36+4  neutral Altrex liner was impacted with good visualized rim fit.  The cup was positioned anatomically within the acetabular portion of the pelvis.      At this point, the femur was rolled to 100 degrees.  Further capsule was   released off the inferior aspect of the femoral neck.  I then   released the superior capsule proximally.  With the leg in a neutral position the hook was placed laterally   along the femur under the vastus lateralis origin and elevated manually and then held in position using the hook attachment on the bed.  The leg was then extended and adducted with the leg rolled to 100   degrees of external rotation.  Retractors were placed along the medial calcar and posteriorly over the greater trochanter.  Once the proximal femur was fully   exposed, I used a box osteotome to set orientation.  I then began   broaching with the starting chili pepper broach and passed this by hand and then broached up to 10.  With the 10 broach in place I chose a high offset neck and did several trial reductions.  The offset was appropriate, leg lengths   appeared to be equal best matched with the +1.5 head ball trial confirmed radiographically.   Given these findings, I went ahead and dislocated the hip, repositioned all   retractors and positioned the right hip in the extended and abducted position.  The final 10 Hi Actis stem was   chosen and it was impacted down to the level of neck cut.  Based on this   and the trial reductions, a final 36+1.5 delta ceramic ball was chosen and   impacted onto a clean and dry trunnion, and the hip was reduced.  The   hip had been irrigated throughout the case again at this point.  I did   reapproximate the superior capsular leaflet to the anterior leaflet   using #1 Vicryl.  The fascia of the   tensor fascia lata muscle was then reapproximated using #1 Vicryl and #0 Stratafix sutures.  The   remaining wound was closed with 2-0 Vicryl and running 4-0 Monocryl.    The hip was cleaned, dried, and dressed sterilely using Dermabond and   Aquacel dressing.  The patient was then brought   to recovery room in  stable condition tolerating the procedure well.    Lanney GinsMatthew Babish, PA-C was present for the entirety of the case involved from   preoperative positioning, perioperative retractor management, general   facilitation of the case, as well as primary wound closure as assistant.            Madlyn FrankelMatthew D. Charlann Boxerlin, M.D.        01/26/2019 1:58 PM

## 2019-01-26 NOTE — Anesthesia Procedure Notes (Signed)
Spinal  Patient location during procedure: OR Start time: 01/26/2019 1:42 PM End time: 01/26/2019 1:45 PM Staffing Anesthesiologist: Janeece Riggers, MD Preanesthetic Checklist Completed: patient identified, site marked, surgical consent, pre-op evaluation, timeout performed, IV checked, risks and benefits discussed and monitors and equipment checked Spinal Block Patient position: sitting Prep: DuraPrep Patient monitoring: heart rate, cardiac monitor, continuous pulse ox and blood pressure Approach: midline Location: L3-4 Injection technique: single-shot Needle Needle type: Sprotte  Needle gauge: 24 G Needle length: 9 cm Assessment Sensory level: T4

## 2019-01-27 ENCOUNTER — Encounter (HOSPITAL_COMMUNITY): Payer: Self-pay | Admitting: Orthopedic Surgery

## 2019-01-27 DIAGNOSIS — M1612 Unilateral primary osteoarthritis, left hip: Secondary | ICD-10-CM | POA: Diagnosis present

## 2019-01-27 DIAGNOSIS — Z6833 Body mass index (BMI) 33.0-33.9, adult: Secondary | ICD-10-CM | POA: Diagnosis not present

## 2019-01-27 DIAGNOSIS — Z79899 Other long term (current) drug therapy: Secondary | ICD-10-CM | POA: Diagnosis not present

## 2019-01-27 DIAGNOSIS — E669 Obesity, unspecified: Secondary | ICD-10-CM | POA: Diagnosis present

## 2019-01-27 DIAGNOSIS — M170 Bilateral primary osteoarthritis of knee: Secondary | ICD-10-CM | POA: Diagnosis present

## 2019-01-27 DIAGNOSIS — G4730 Sleep apnea, unspecified: Secondary | ICD-10-CM | POA: Diagnosis present

## 2019-01-27 DIAGNOSIS — M19049 Primary osteoarthritis, unspecified hand: Secondary | ICD-10-CM | POA: Diagnosis present

## 2019-01-27 DIAGNOSIS — Z7982 Long term (current) use of aspirin: Secondary | ICD-10-CM | POA: Diagnosis not present

## 2019-01-27 DIAGNOSIS — I10 Essential (primary) hypertension: Secondary | ICD-10-CM | POA: Diagnosis present

## 2019-01-27 LAB — BASIC METABOLIC PANEL
Anion gap: 10 (ref 5–15)
BUN: 14 mg/dL (ref 6–20)
CO2: 26 mmol/L (ref 22–32)
Calcium: 8.2 mg/dL — ABNORMAL LOW (ref 8.9–10.3)
Chloride: 101 mmol/L (ref 98–111)
Creatinine, Ser: 0.95 mg/dL (ref 0.61–1.24)
GFR calc Af Amer: >60 mL/min (ref >60)
GFR calc non Af Amer: >60 mL/min (ref >60)
Glucose, Bld: 151 mg/dL — ABNORMAL HIGH (ref 70–99)
Potassium: 4.4 mmol/L (ref 3.5–5.1)
Sodium: 137 mmol/L (ref 135–145)

## 2019-01-27 LAB — CBC
HCT: 39.8 % (ref 39.0–52.0)
Hemoglobin: 13.7 g/dL (ref 13.0–17.0)
MCH: 32.6 pg (ref 26.0–34.0)
MCHC: 34.4 g/dL (ref 30.0–36.0)
MCV: 94.8 fL (ref 80.0–100.0)
Platelets: 189 10*3/uL (ref 150–400)
RBC: 4.2 MIL/uL — ABNORMAL LOW (ref 4.22–5.81)
RDW: 12.4 % (ref 11.5–15.5)
WBC: 16.2 10*3/uL — ABNORMAL HIGH (ref 4.0–10.5)
nRBC: 0 % (ref 0.0–0.2)

## 2019-01-27 MED ORDER — ASPIRIN 81 MG PO CHEW: 81.0000 mg | CHEWABLE_TABLET | Freq: Two times a day (BID) | ORAL | 0 refills | Status: DC

## 2019-01-27 MED ORDER — DOCUSATE SODIUM 100 MG PO CAPS: 100.0000 mg | ORAL_CAPSULE | Freq: Two times a day (BID) | ORAL | 0 refills | Status: DC

## 2019-01-27 MED ORDER — FERROUS SULFATE 325 (65 FE) MG PO TABS: 325.0000 mg | ORAL_TABLET | Freq: Three times a day (TID) | ORAL | 3 refills | Status: DC

## 2019-01-27 MED ORDER — POLYETHYLENE GLYCOL 3350 17 G PO PACK: 17.0000 g | PACK | Freq: Two times a day (BID) | ORAL | 0 refills | Status: DC

## 2019-01-27 MED ORDER — METHOCARBAMOL 500 MG PO TABS: 500.0000 mg | ORAL_TABLET | Freq: Four times a day (QID) | ORAL | 0 refills | Status: DC | PRN

## 2019-01-27 MED ORDER — HYDROCODONE-ACETAMINOPHEN 5-325 MG PO TABS: 1.0000 | ORAL_TABLET | ORAL | 0 refills | Status: DC | PRN

## 2019-01-27 NOTE — Progress Notes (Signed)
     Subjective: 1 Day Post-Op Procedure(s) (LRB): TOTAL HIP ARTHROPLASTY ANTERIOR APPROACH (Left)   [name], pain controlled. No events throughout the night. Discussed the procedure and expectations.  Ready to be discharged home if he does well with PT.    Objective:   VITALS:   Vitals:   01/26/19 2352 01/27/19 0408  BP: 121/76 (!) 120/52  Pulse: 87 73  Resp: 18 18  Temp: 98.4 F (36.9 C) 97.6 F (36.4 C)  SpO2: 98% 96%    Dorsiflexion/Plantar flexion intact Incision: dressing C/D/I No cellulitis present Compartment soft  LABS Recent Labs    01/27/19 0303  HGB 13.7  HCT 39.8  WBC 16.2*  PLT 189    Recent Labs    01/27/19 0303  NA 137  K 4.4  BUN 14  CREATININE 0.95  GLUCOSE 151*     Assessment/Plan: 1 Day Post-Op Procedure(s) (LRB): TOTAL HIP ARTHROPLASTY ANTERIOR APPROACH (Left) Foley cath d/c'ed Advance diet Up with therapy D/C IV fluids Discharge home with home health Follow up in 2 weeks at [facility] ([place] Follow up with OLIN,Tallie Hevia D in 2 weeks.  Contact information:  [facility]) [address], [place] [phone]    Obese (BMI 30-39.9) Estimated body mass index is 33.18 kg/m as calculated from the following:   Height as of this encounter: 5\' 11"  (1.803 m).   Weight as of this encounter: 107.9 kg. Patient also counseled that weight may inhibit the healing process Patient counseled that losing weight will help with future health issues     West Pugh. Corene Resnick   PAC  01/27/2019, 8:28 AM

## 2019-01-27 NOTE — Evaluation (Signed)
Physical Therapy Evaluation Patient Details Name: Cole Gill MRN: [id number] DOB: [birth year] Today's Date: 01/27/2019   History of Present Illness  53 yo male s/p L THA-direct anterior 01/26/19. Hx of R THA-DA  Clinical Impression  On eval, pt was Min guard assist for mobility. He walked ~150 feet with a RW. Reviewed/practiced gait training, stair training, and exercises. Issued HEP for pt to perform 2x/day. All education completed. Okay to d/c from PT standpoint-made RN aware.     Follow Up Recommendations Follow surgeon's recommendation for DC plan and follow-up therapies(HEP)    Equipment Recommendations  None recommended by PT    Recommendations for Other Services       Precautions / Restrictions Precautions Precautions: Fall Restrictions Weight Bearing Restrictions: No Other Position/Activity Restrictions: WBAT      Mobility  Bed Mobility Overal bed mobility: Needs Assistance Bed Mobility: Supine to Sit     Supine to sit: Min guard;HOB elevated     General bed mobility comments: close guard for safety. increased time.  Transfers Overall transfer level: Needs assistance Equipment used: Rolling walker (2 wheeled) Transfers: Sit to/from Stand Sit to Stand: Min guard         General transfer comment: close guard for safety. Vcs safety, hand placement  Ambulation/Gait Ambulation/Gait assistance: Min guard Gait Distance (Feet): 150 Feet Assistive device: Rolling walker (2 wheeled) Gait Pattern/deviations: Step-through pattern;Decreased stride length     General Gait Details: close guard for safety. VCs safety, sequence. Pt transitioned to step through pattern as distance increased.  Stairs Stairs: Yes Stairs assistance: Min guard Stair Management: Forwards;Step to pattern Number of Stairs: 2 General stair comments: up and over portable steps. vcs safety, sequence. close guard for safety.  Wheelchair Mobility    Modified Rankin (Stroke Patients  Only)       Balance Overall balance assessment: Mild deficits observed, not formally tested                                           Pertinent Vitals/Pain Pain Assessment: 0-10 Pain Score: 5  Pain Location: L hip Pain Descriptors / Indicators: Aching;Tightness;Sore Pain Intervention(s): Monitored during session;Ice applied    Home Living Family/patient expects to be discharged to:: Private residence Living Arrangements: Spouse/significant other   Type of Home: House Home Access: Stairs to enter Entrance Stairs-Rails: Right Entrance Stairs-Number of Steps: 6 Home Layout: Able to live on main level with bedroom/bathroom Home Equipment: Walker - 2 wheels;Bedside commode      Prior Function Level of Independence: Independent               Hand Dominance        Extremity/Trunk Assessment   Upper Extremity Assessment Upper Extremity Assessment: Overall WFL for tasks assessed    Lower Extremity Assessment Lower Extremity Assessment: Generalized weakness(post op weakness s/p L THA)    Cervical / Trunk Assessment Cervical / Trunk Assessment: Normal  Communication   Communication: No difficulties  Cognition Arousal/Alertness: Awake/alert Behavior During Therapy: WFL for tasks assessed/performed Overall Cognitive Status: Within Functional Limits for tasks assessed                                        General Comments      Exercises Total Joint Exercises Ankle Circles/Pumps: AROM;Both;10 reps;Supine  Quad Sets: AROM;Both;10 reps;Supine Heel Slides: AROM;Left;10 reps;Supine Hip ABduction/ADduction: AROM;Left;10 reps;Supine Long Arc Quad: AROM;Left;10 reps;Seated Knee Flexion: AROM;Left;10 reps;Standing Marching in Standing: AROM;Right;Left;10 reps;Standing General Exercises - Lower Extremity Heel Raises: Both;10 reps;Standing;AROM   Assessment/Plan    PT Assessment    PT Problem List         PT Treatment  Interventions      PT Goals (Current goals can be found in the Care Plan section)  Acute Rehab PT Goals Patient Stated Goal: regain PLOF PT Goal Formulation: With patient Time For Goal Achievement: 02/10/19 Potential to Achieve Goals: Good    Frequency     Barriers to discharge        Co-evaluation               AM-PAC PT "6 Clicks" Mobility  Outcome Measure Help needed turning from your back to your side while in a flat bed without using bedrails?: A Little Help needed moving from lying on your back to sitting on the side of a flat bed without using bedrails?: A Little Help needed moving to and from a bed to a chair (including a wheelchair)?: A Little Help needed standing up from a chair using your arms (e.g., wheelchair or bedside chair)?: A Little Help needed to walk in hospital room?: A Little Help needed climbing 3-5 steps with a railing? : A Little 6 Click Score: 18    End of Session Equipment Utilized During Treatment: Gait belt Activity Tolerance: Patient tolerated treatment well Patient left: in chair;with call bell/phone within reach   PT Visit Diagnosis: Other abnormalities of gait and mobility (R26.89)    Time: [id number] PT Time Calculation (min) (ACUTE ONLY): 36 min   Charges:   PT Evaluation $PT Eval Low Complexity: 1 Low PT Treatments $Gait Training: 8-22 mins          Rebeca AlertJannie Tyeisha Dinan, PT Acute Rehabilitation Services Pager: [phone] Office: [phone]

## 2019-01-27 NOTE — Discharge Summary (Signed)
Patient ID: Cole Gill MRN: [id number] DOB/AGE: August 05, 1966 53 y.o.  Admit date: 01/26/2019 Discharge date: 01/27/2019  Admission Diagnoses:  Principal Problem:   S/P left THA, AA Active Problems:   Obese   Discharge Diagnoses:  Same  Past Medical History:  Diagnosis Date  . Anxiety   . Arthritis    hips knees fingers  . Depression   . Hypertension    Dr. Burnett Harry  . Sleep apnea    C-pap    Surgeries: Procedure(s): TOTAL HIP ARTHROPLASTY ANTERIOR APPROACH on 01/26/2019   Consultants:   Discharged Condition: Improved  Hospital Course: Rama Mcclintock is an 53 y.o. male who was admitted 01/26/2019 for operative treatment of Status post left hip replacement. Patient has severe unremitting pain that affects sleep, daily activities, and work/hobbies. After pre-op clearance the patient was taken to the operating room on 01/26/2019 and underwent  Procedure(s): TOTAL HIP ARTHROPLASTY ANTERIOR APPROACH.    Patient was given perioperative antibiotics:  Anti-infectives (From admission, onward)   Start     Dose/Rate Route Frequency Ordered Stop   01/27/19 0600  ceFAZolin (ANCEF) IVPB 2g/100 mL premix     2 g 200 mL/hr over 30 Minutes Intravenous On call to O.R. 01/26/19 1244 01/26/19 1343   01/26/19 2000  ceFAZolin (ANCEF) IVPB 2g/100 mL premix     2 g 200 mL/hr over 30 Minutes Intravenous Every 6 hours 01/26/19 1708 01/27/19 0214   01/26/19 0600  vancomycin (VANCOCIN) 1,500 mg in sodium chloride 0.9 % 500 mL IVPB     1,500 mg 250 mL/hr over 120 Minutes Intravenous On call to O.R. 01/25/19 0745 01/26/19 1513       Patient was given sequential compression devices, early ambulation, and chemoprophylaxis to prevent DVT.  Patient benefited maximally from hospital stay and there were no complications.    Recent vital signs:  Patient Vitals for the past 24 hrs:  BP Temp Temp src Pulse Resp SpO2 Height Weight  01/27/19 0408 (!) 120/52 97.6 F (36.4 C) Oral 73 18 96 % - -   01/26/19 2352 121/76 98.4 F (36.9 C) Oral 87 18 98 % - -  01/26/19 2010 113/66 98.2 F (36.8 C) Oral 79 16 97 % - -  01/26/19 1914 119/79 98.2 F (36.8 C) Oral 81 16 96 % - -  01/26/19 1806 (!) 144/70 - - 78 16 99 % - -  01/26/19 1706 125/76 97.6 F (36.4 C) Oral 62 16 97 % - -  01/26/19 1654 121/78 (!) 97.4 F (36.3 C) - 67 (!) 21 98 % - -  01/26/19 1645 121/81 - - 63 10 96 % - -  01/26/19 1630 122/81 - - 63 12 97 % - -  01/26/19 1615 121/76 - - 64 14 98 % - -  01/26/19 1600 105/73 - - 68 11 97 % - -  01/26/19 1554 123/73 (!) 97.4 F (36.3 C) - 68 12 98 % - -  01/26/19 1309 - - - - - - 5\' 11"  (1.803 m) 107.9 kg  01/26/19 1255 (!) 129/93 98.7 F (37.1 C) Oral 74 17 96 % - -     Recent laboratory studies:  Recent Labs    01/27/19 0303  WBC 16.2*  HGB 13.7  HCT 39.8  PLT 189  NA 137  K 4.4  CL 101  CO2 26  BUN 14  CREATININE 0.95  GLUCOSE 151*  CALCIUM 8.2*     Discharge Medications:   Allergies as of 01/27/2019  No Known Allergies     Medication List    STOP taking these medications   acetaminophen 500 MG tablet Commonly known as: TYLENOL   aspirin 81 MG EC tablet Replaced by: aspirin 81 MG chewable tablet     TAKE these medications   amLODIPine-Valsartan-HCTZ 10-320-25 MG Tabs Take 1 tablet by mouth daily.   aspirin 81 MG chewable tablet Commonly known as: Aspirin Childrens Chew 1 tablet (81 mg total) by mouth 2 (two) times daily for 30 days. Take for 4 weeks, then resume regular dose. Start taking on: January 28, 2019 Replaces: aspirin 81 MG EC tablet   celecoxib 200 MG capsule Commonly known as: CELEBREX Take 200 mg by mouth daily.   docusate sodium 100 MG capsule Commonly known as: Colace Take 1 capsule (100 mg total) by mouth 2 (two) times daily.   ferrous sulfate 325 (65 FE) MG tablet Commonly known as: FerrouSul Take 1 tablet (325 mg total) by mouth 3 (three) times daily with meals.   fluticasone 50 MCG/ACT nasal spray Commonly  known as: FLONASE Place 2 sprays into both nostrils 2 (two) times a day.   HYDROcodone-acetaminophen 5-325 MG tablet Commonly known as: NORCO/VICODIN Take 1-2 tablets by mouth every 4 (four) hours as needed.   Magnesium 400 MG Tabs Take 800 mg by mouth at bedtime.   methocarbamol 500 MG tablet Commonly known as: Robaxin Take 1 tablet (500 mg total) by mouth every 6 (six) hours as needed for muscle spasms.   polyethylene glycol 17 g packet Commonly known as: MIRALAX / GLYCOLAX Take 17 g by mouth 2 (two) times daily.   Potassium 99 MG Tabs Take 99 mg by mouth daily.   tamsulosin 0.4 MG Caps capsule Commonly known as: FLOMAX Take 0.4 mg by mouth daily.   testosterone cypionate 200 MG/ML injection Commonly known as: DEPOTESTOSTERONE CYPIONATE Inject 200 mg into the muscle once a week.   Viibryd 20 MG Tabs Generic drug: Vilazodone HCl Take 20 mg by mouth daily.            Discharge Care Instructions  (From admission, onward)         Start     Ordered   01/27/19 0000  Change dressing    Comments: Maintain surgical dressing until follow up in the clinic. If the edges start to pull up, may reinforce with tape. If the dressing is no longer working, may remove and cover with gauze and tape, but must keep the area dry and clean.  Call with any questions or concerns.   01/27/19 0835          Diagnostic Studies: Dg Pelvis Portable  Result Date: 01/26/2019 CLINICAL DATA:  Left hip replacement EXAM: PORTABLE PELVIS 1-2 VIEWS COMPARISON:  None. FINDINGS: Interval left total hip arthroplasty. Normal alignment. No hardware failure or complication. No acute fracture or dislocation. Postsurgical changes in the surrounding soft tissues. Prior right hip arthroplasty. IMPRESSION: Interval left total hip arthroplasty. Electronically Signed   By: Elige KoHetal  Patel   On: 01/26/2019 16:35   Dg C-arm 1-60 Min-no Report  Result Date: 01/26/2019 Fluoroscopy was utilized by the requesting  physician.  No radiographic interpretation.   Dg Hip Operative Unilat With Pelvis Left  Result Date: 01/26/2019 CLINICAL DATA:  Left anterior hip replacement. EXAM: OPERATIVE left HIP (WITH PELVIS IF PERFORMED) 9 VIEWS TECHNIQUE: Fluoroscopic spot image(s) were submitted for interpretation post-operatively. COMPARISON:  None. FINDINGS: The patient has undergone total hip arthroplasty on the left. A total of  9 images were obtained. The fluoroscopy time was 22 seconds. Final images demonstrate total hip arthroplasty on the left with near anatomic alignment. The femoral head component was not yet visualized. The patient is status post remote total hip arthroplasty on the right. IMPRESSION: Status post total hip arthroplasty on the left. Electronically Signed   By: Katherine Mantlehristopher  Green M.D.   On: 01/26/2019 17:19    Disposition: Discharge disposition: 01-Home or Self Care       Discharge Instructions    Call MD / Call 911   Complete by: As directed    If you experience chest pain or shortness of breath, CALL 911 and be transported to the hospital emergency room.  If you develope a fever above 101 F, pus (white drainage) or increased drainage or redness at the wound, or calf pain, call your surgeon's office.   Change dressing   Complete by: As directed    Maintain surgical dressing until follow up in the clinic. If the edges start to pull up, may reinforce with tape. If the dressing is no longer working, may remove and cover with gauze and tape, but must keep the area dry and clean.  Call with any questions or concerns.   Constipation Prevention   Complete by: As directed    Drink plenty of fluids.  Prune juice may be helpful.  You may use a stool softener, such as Colace (over the counter) 100 mg twice a day.  Use MiraLax (over the counter) for constipation as needed.   Diet - low sodium heart healthy   Complete by: As directed    Discharge instructions   Complete by: As directed    Maintain  surgical dressing until follow up in the clinic. If the edges start to pull up, may reinforce with tape. If the dressing is no longer working, may remove and cover with gauze and tape, but must keep the area dry and clean.  [facility]. Call with any questions or concerns.   Increase activity slowly as tolerated   Complete by: As directed    Weight bearing as tolerated with assist device (walker, cane, etc) as directed, use it as long as suggested by your surgeon or therapist, typically at least 4-6 weeks.   TED hose   Complete by: As directed    Use stockings (TED hose) for 2 weeks on both leg(s).  You may remove them at night for sleeping.      Follow-up Information    Durene Romanslin, Macalister Arnaud, MD. Schedule an appointment as soon as possible for a visit in 2 weeks.   Specialty: Orthopedic Surgery Contact information: [address] [place] [id number] [phone]            Signed: Genelle GatherMatthew Scott [facility] 01/27/2019, 8:35 AM

## 2019-01-27 NOTE — Plan of Care (Signed)
Patient discharged home in stable condition. Waiting on his ride 

## 2019-01-29 NOTE — Anesthesia Postprocedure Evaluation (Signed)
Anesthesia Post Note  Patient: Cole Gill  Procedure(s) Performed: TOTAL HIP ARTHROPLASTY ANTERIOR APPROACH (Left Hip)     Patient location during evaluation: PACU Anesthesia Type: Spinal Level of consciousness: oriented and awake and alert Pain management: pain level controlled Vital Signs Assessment: post-procedure vital signs reviewed and stable Respiratory status: spontaneous breathing, respiratory function stable and patient connected to nasal cannula oxygen Cardiovascular status: blood pressure returned to baseline and stable Postop Assessment: no headache, no backache and no apparent nausea or vomiting Anesthetic complications: no    Last Vitals:  Vitals:   01/27/19 0408 01/27/19 0915  BP: (!) 120/52 139/60  Pulse: 73 75  Resp: 18 16  Temp: 36.4 C 36.9 C  SpO2: 96% 95%    Last Pain:  Vitals:   01/27/19 1000  TempSrc:   PainSc: 2                  Hykeem Ojeda COKER

## 2019-02-22 ENCOUNTER — Inpatient Hospital Stay (HOSPITAL_COMMUNITY)
Admission: AD | Admit: 2019-02-22 | Discharge: 2019-02-26 | DRG: 465 | Disposition: A | Discharge status: Home Health | Payer: BC Managed Care – PPO | Source: Other Acute Inpatient Hospital | Attending: Orthopedic Surgery | Admitting: Orthopedic Surgery

## 2019-02-22 ENCOUNTER — Other Ambulatory Visit: Payer: Self-pay

## 2019-02-22 ENCOUNTER — Encounter (HOSPITAL_COMMUNITY): Payer: Self-pay

## 2019-02-22 DIAGNOSIS — Y838 Other surgical procedures as the cause of abnormal reaction of the patient, or of later complication, without mention of misadventure at the time of the procedure: Secondary | ICD-10-CM | POA: Diagnosis present

## 2019-02-22 DIAGNOSIS — F419 Anxiety disorder, unspecified: Secondary | ICD-10-CM | POA: Diagnosis present

## 2019-02-22 DIAGNOSIS — Z87891 Personal history of nicotine dependence: Secondary | ICD-10-CM | POA: Diagnosis not present

## 2019-02-22 DIAGNOSIS — T8140XA Infection following a procedure, unspecified, initial encounter: Secondary | ICD-10-CM | POA: Diagnosis present

## 2019-02-22 DIAGNOSIS — Z96649 Presence of unspecified artificial hip joint: Secondary | ICD-10-CM

## 2019-02-22 DIAGNOSIS — F329 Major depressive disorder, single episode, unspecified: Secondary | ICD-10-CM | POA: Diagnosis present

## 2019-02-22 DIAGNOSIS — Z8614 Personal history of Methicillin resistant Staphylococcus aureus infection: Secondary | ICD-10-CM

## 2019-02-22 DIAGNOSIS — G4730 Sleep apnea, unspecified: Secondary | ICD-10-CM | POA: Diagnosis present

## 2019-02-22 DIAGNOSIS — T8452XA Infection and inflammatory reaction due to internal left hip prosthesis, initial encounter: Secondary | ICD-10-CM | POA: Diagnosis present

## 2019-02-22 DIAGNOSIS — I10 Essential (primary) hypertension: Secondary | ICD-10-CM | POA: Diagnosis present

## 2019-02-22 DIAGNOSIS — Z1159 Encounter for screening for other viral diseases: Secondary | ICD-10-CM | POA: Diagnosis not present

## 2019-02-22 LAB — COMPREHENSIVE METABOLIC PANEL
ALT: 34 U/L (ref 0–44)
AST: 25 U/L (ref 15–41)
Albumin: 3.5 g/dL (ref 3.5–5.0)
Alkaline Phosphatase: 76 U/L (ref 38–126)
Anion gap: 11 (ref 5–15)
BUN: 15 mg/dL (ref 6–20)
CO2: 27 mmol/L (ref 22–32)
Calcium: 9.3 mg/dL (ref 8.9–10.3)
Chloride: 104 mmol/L (ref 98–111)
Creatinine, Ser: 1.06 mg/dL (ref 0.61–1.24)
GFR calc Af Amer: >60 mL/min (ref >60)
GFR calc non Af Amer: >60 mL/min (ref >60)
Glucose, Bld: 99 mg/dL (ref 70–99)
Potassium: 4.1 mmol/L (ref 3.5–5.1)
Sodium: 142 mmol/L (ref 135–145)
Total Bilirubin: 0.5 mg/dL (ref 0.3–1.2)
Total Protein: 6.8 g/dL (ref 6.5–8.1)

## 2019-02-22 LAB — CBC
HCT: 41.6 % (ref 39.0–52.0)
Hemoglobin: 13.7 g/dL (ref 13.0–17.0)
MCH: 31.1 pg (ref 26.0–34.0)
MCHC: 32.9 g/dL (ref 30.0–36.0)
MCV: 94.3 fL (ref 80.0–100.0)
Platelets: 358 10*3/uL (ref 150–400)
RBC: 4.41 MIL/uL (ref 4.22–5.81)
RDW: 12.7 % (ref 11.5–15.5)
WBC: 5.6 10*3/uL (ref 4.0–10.5)
nRBC: 0 % (ref 0.0–0.2)

## 2019-02-22 LAB — C-REACTIVE PROTEIN: CRP: 5.5 mg/dL — ABNORMAL HIGH (ref <1.0)

## 2019-02-22 LAB — PROTIME-INR
INR: 1.1 (ref 0.8–1.2)
Prothrombin Time: 13.6 seconds (ref 11.4–15.2)

## 2019-02-22 LAB — SEDIMENTATION RATE: Sed Rate: 49 mm/hr — ABNORMAL HIGH (ref 0–16)

## 2019-02-22 LAB — SARS CORONAVIRUS 2 BY RT PCR (HOSPITAL ORDER, PERFORMED IN ~~LOC~~ HOSPITAL LAB): SARS Coronavirus 2: NEGATIVE

## 2019-02-22 MED ORDER — CEFAZOLIN SODIUM-DEXTROSE 2-4 GM/100ML-% IV SOLN
2.0000 g | Freq: Four times a day (QID) | INTRAVENOUS | Status: DC
  Administered 2019-02-22 – 2019-02-24 (×8): 2 g via INTRAVENOUS
  Filled 2019-02-22 (×8): qty 100

## 2019-02-22 MED ORDER — IRBESARTAN 150 MG PO TABS: 300.0000 mg | ORAL_TABLET | Freq: Every day | ORAL | Status: DC

## 2019-02-22 MED ORDER — AMLODIPINE BESYLATE 10 MG PO TABS
10.0000 mg | ORAL_TABLET | Freq: Every day | ORAL | Status: DC
  Administered 2019-02-22 – 2019-02-26 (×4): 10 mg via ORAL
  Filled 2019-02-22 (×4): qty 1

## 2019-02-22 MED ORDER — SODIUM CHLORIDE 0.9 % IV SOLN
INTRAVENOUS | Status: DC
  Administered 2019-02-22: 16:00:00 via INTRAVENOUS
  Administered 2019-02-23: 17:00:00 1000 mL via INTRAVENOUS
  Administered 2019-02-24 – 2019-02-26 (×3): via INTRAVENOUS

## 2019-02-22 MED ORDER — FLUTICASONE PROPIONATE 50 MCG/ACT NA SUSP
2.0000 | Freq: Two times a day (BID) | NASAL | Status: DC
  Administered 2019-02-22 – 2019-02-26 (×7): 2 via NASAL
  Filled 2019-02-22: qty 16

## 2019-02-22 MED ORDER — DOCUSATE SODIUM 100 MG PO CAPS
100.0000 mg | ORAL_CAPSULE | Freq: Two times a day (BID) | ORAL | Status: DC
  Administered 2019-02-22: 22:00:00 100 mg via ORAL
  Filled 2019-02-22: qty 1

## 2019-02-22 MED ORDER — DIPHENHYDRAMINE HCL 12.5 MG/5ML PO ELIX
12.5000 mg | ORAL_SOLUTION | ORAL | Status: DC | PRN
  Administered 2019-02-23 – 2019-02-24 (×2): 12.5 mg via ORAL
  Filled 2019-02-22 (×2): qty 5

## 2019-02-22 MED ORDER — POLYETHYLENE GLYCOL 3350 17 G PO PACK: 17.0000 g | PACK | Freq: Every day | ORAL | Status: DC | PRN

## 2019-02-22 MED ORDER — FERROUS SULFATE 325 (65 FE) MG PO TABS
325.0000 mg | ORAL_TABLET | Freq: Three times a day (TID) | ORAL | Status: DC
  Administered 2019-02-22 – 2019-02-26 (×8): 325 mg via ORAL
  Filled 2019-02-22 (×8): qty 1

## 2019-02-22 MED ORDER — IRBESARTAN 150 MG PO TABS
300.0000 mg | ORAL_TABLET | Freq: Every day | ORAL | Status: DC
  Administered 2019-02-22 – 2019-02-26 (×4): 300 mg via ORAL
  Filled 2019-02-22 (×4): qty 2

## 2019-02-22 MED ORDER — POTASSIUM 99 MG PO TABS: 99.0000 mg | ORAL_TABLET | Freq: Every day | ORAL | Status: DC

## 2019-02-22 MED ORDER — TRAMADOL HCL 50 MG PO TABS: 50.0000 mg | ORAL_TABLET | Freq: Four times a day (QID) | ORAL | Status: DC | PRN

## 2019-02-22 MED ORDER — HYDROCHLOROTHIAZIDE 25 MG PO TABS
25.0000 mg | ORAL_TABLET | Freq: Every day | ORAL | Status: DC
  Administered 2019-02-22 – 2019-02-26 (×4): 25 mg via ORAL
  Filled 2019-02-22 (×4): qty 1

## 2019-02-22 MED ORDER — HYDROCHLOROTHIAZIDE 25 MG PO TABS: 25.0000 mg | ORAL_TABLET | Freq: Every day | ORAL | Status: DC

## 2019-02-22 MED ORDER — ONDANSETRON HCL 4 MG/2ML IJ SOLN: 4.0000 mg | Freq: Four times a day (QID) | INTRAMUSCULAR | Status: DC | PRN

## 2019-02-22 MED ORDER — AMLODIPINE-VALSARTAN-HCTZ 10-320-25 MG PO TABS: 1.0000 | ORAL_TABLET | Freq: Every day | ORAL | Status: DC

## 2019-02-22 MED ORDER — ONDANSETRON HCL 4 MG PO TABS: 4.0000 mg | ORAL_TABLET | Freq: Four times a day (QID) | ORAL | Status: DC | PRN

## 2019-02-22 MED ORDER — METHOCARBAMOL 500 MG PO TABS
500.0000 mg | ORAL_TABLET | Freq: Four times a day (QID) | ORAL | Status: DC | PRN
  Administered 2019-02-23 – 2019-02-26 (×4): 500 mg via ORAL
  Filled 2019-02-22 (×6): qty 1

## 2019-02-22 MED ORDER — HYDROCODONE-ACETAMINOPHEN 5-325 MG PO TABS: 1.0000 | ORAL_TABLET | Freq: Four times a day (QID) | ORAL | Status: DC | PRN

## 2019-02-22 MED ORDER — METHOCARBAMOL 1000 MG/10ML IJ SOLN
500.0000 mg | Freq: Four times a day (QID) | INTRAVENOUS | Status: DC | PRN
  Filled 2019-02-22: qty 5

## 2019-02-22 MED ORDER — AMLODIPINE BESYLATE 10 MG PO TABS: 10.0000 mg | ORAL_TABLET | Freq: Every day | ORAL | Status: DC

## 2019-02-22 MED ORDER — ACETAMINOPHEN 325 MG PO TABS
325.0000 mg | ORAL_TABLET | Freq: Four times a day (QID) | ORAL | Status: DC | PRN
  Administered 2019-02-23 – 2019-02-26 (×4): 650 mg via ORAL
  Filled 2019-02-22 (×7): qty 2

## 2019-02-22 MED ORDER — TAMSULOSIN HCL 0.4 MG PO CAPS
0.4000 mg | ORAL_CAPSULE | Freq: Every evening | ORAL | Status: DC
  Administered 2019-02-22 – 2019-02-26 (×5): 0.4 mg via ORAL
  Filled 2019-02-22 (×5): qty 1

## 2019-02-22 MED ORDER — VILAZODONE HCL 20 MG PO TABS
20.0000 mg | ORAL_TABLET | Freq: Every day | ORAL | Status: DC
  Administered 2019-02-22 – 2019-02-26 (×4): 20 mg via ORAL
  Filled 2019-02-22 (×8): qty 1

## 2019-02-22 NOTE — H&P (Signed)
Cole Gill is an 53 y.o. male.    Chief Complaint: Infected left hip, superficial vs deep s/p THA, AA  Procedure:  Left hip I&D, superficial vs deep  HPI: Pt is a 53 y.o. male complaining of left hip pain for 4 days.  [name], AA per Dr. Charlann Boxerlin on 01/26/2019.  [name] of the left hip around previous incision on Friday.  Saturday the swelling reduced, but returned on Sunday along with some drainage from the incision.  Patient presented to the ER on Sunday where he was given some antibiotics and told to follow up back to [place] [facility].  Patient presented to the hospital as a direct admit today.  He states that he was getting along really well prior to this incident and really hasn't had any pain.  Various options are discussed with the patient and it was decided that the best treatment would be to I&D the area. We have discussed superficial vs deep depending on the depth of the infection.  Risks, benefits and expectations were discussed with the patient. Patient understand the risks, benefits and expectations and wishes to proceed with surgery.    PMH: Past Medical History:  Diagnosis Date  . Anxiety   . Arthritis    hips knees fingers  . Depression   . Hypertension    Dr. Georgina PillionMassey  . Sleep apnea    C-pap    PSH: Past Surgical History:  Procedure Laterality Date  . COLONOSCOPY  2015  . JOINT REPLACEMENT  2016  . KNEE ARTHROSCOPY WITH PATELLAR TENDON REPAIR Right 2017  . SHOULDER ARTHROSCOPY Left 2005  . TOTAL HIP ARTHROPLASTY Left 01/26/2019   Procedure: TOTAL HIP ARTHROPLASTY ANTERIOR APPROACH;  Surgeon: Durene Romanslin, Vearl Allbaugh, MD;  Location: [facility];  Service: Orthopedics;  Laterality: Left;    Social History:  reports that he has never smoked. He quit smokeless tobacco use about 6 months ago.  His smokeless tobacco use included chew. He reports previous alcohol use. He reports that he does not use drugs.  Allergies:  No Known  Allergies  Medications: Current Facility-Administered Medications  Medication Dose Route Frequency Provider Last Rate Last Dose  . 0.9 %  sodium chloride infusion   Intravenous Continuous Tommie ArdStinson, Ashley R, PA-C 50 mL/hr at 02/22/19 1558    . acetaminophen (TYLENOL) tablet 325-650 mg  325-650 mg Oral Q6H PRN Tommie ArdStinson, Ashley R, PA-C      . [START ON 02/23/2019] amLODipine (NORVASC) tablet 10 mg  10 mg Oral Daily Durene Romanslin, Lastacia Solum, MD       And  . Melene Muller[START ON 02/23/2019] hydrochlorothiazide (HYDRODIURIL) tablet 25 mg  25 mg Oral Daily Durene Romanslin, Shaya Reddick, MD       And  . Melene Muller[START ON 02/23/2019] irbesartan (AVAPRO) tablet 300 mg  300 mg Oral Daily Durene Romanslin, Craig Wisnewski, MD      . ceFAZolin (ANCEF) IVPB 2g/100 mL premix  2 g Intravenous Q6H Dennie BibleStinson, Ashley R, PA-C 200 mL/hr at 02/22/19 1559 2 g at 02/22/19 1559  . diphenhydrAMINE (BENADRYL) 12.5 MG/5ML elixir 12.5-25 mg  12.5-25 mg Oral Q4H PRN Tommie ArdStinson, Ashley R, PA-C      . docusate sodium (COLACE) capsule 100 mg  100 mg Oral BID Stinson, Ashley R, PA-C      . ferrous sulfate tablet 325 mg  325 mg Oral TID WC Stinson, Ashley R, PA-C      . fluticasone (FLONASE) 50 MCG/ACT nasal spray 2 spray  2 spray Each Nare BID Maurice March, [place]      . HYDROcodone-acetaminophen (NORCO/VICODIN) 5-325 MG per tablet 1 tablet  1 tablet Oral Q6H PRN Maurice March, PA-C      . methocarbamol (ROBAXIN) tablet 500 mg  500 mg Oral Q6H PRN Griffith Citron R, PA-C       Or  . methocarbamol (ROBAXIN) 500 mg in dextrose 5 % 50 mL IVPB  500 mg Intravenous Q6H PRN Maurice March, PA-C      . ondansetron (ZOFRAN) tablet 4 mg  4 mg Oral Q6H PRN Maurice March, PA-C       Or  . ondansetron (ZOFRAN) injection 4 mg  4 mg Intravenous Q6H PRN Maurice March, PA-C      . polyethylene glycol (MIRALAX / GLYCOLAX) packet 17 g  17 g Oral Daily PRN Maurice March, PA-C      . tamsulosin (FLOMAX) capsule 0.4 mg  0.4 mg Oral QPM Maurice March, PA-C      . Vilazodone HCl TABS 20 mg  20  mg Oral Daily Maurice March, [place]        Results for orders placed or performed during the hospital encounter of 02/22/19 (from the past 48 hour(s))  CBC     Status: None   Collection Time: 02/22/19  2:38 PM  Result Value Ref Range   WBC 5.6 4.0 - 10.5 K/uL   RBC 4.41 4.22 - 5.81 MIL/uL   Hemoglobin 13.7 13.0 - 17.0 g/dL   HCT 41.6 39.0 - 52.0 %   MCV 94.3 80.0 - 100.0 fL   MCH 31.1 26.0 - 34.0 pg   MCHC 32.9 30.0 - 36.0 g/dL   RDW 12.7 11.5 - 15.5 %   Platelets 358 150 - 400 K/uL   nRBC 0.0 0.0 - 0.2 %    Comment: Performed at [facility], [place] [address], [place], [place] [address]  Comprehensive metabolic panel     Status: None   Collection Time: 02/22/19  2:38 PM  Result Value Ref Range   Sodium 142 135 - 145 mmol/L   Potassium 4.1 3.5 - 5.1 mmol/L   Chloride 104 98 - 111 mmol/L   CO2 27 22 - 32 mmol/L   Glucose, Bld 99 70 - 99 mg/dL   BUN 15 6 - 20 mg/dL   Creatinine, Ser 1.06 0.61 - 1.24 mg/dL   Calcium 9.3 8.9 - 10.3 mg/dL   Total Protein 6.8 6.5 - 8.1 g/dL   Albumin 3.5 3.5 - 5.0 g/dL   AST 25 15 - 41 U/L   ALT 34 0 - 44 U/L   Alkaline Phosphatase 76 38 - 126 U/L   Total Bilirubin 0.5 0.3 - 1.2 mg/dL   GFR calc non Af Amer >60 >60 mL/min   GFR calc Af Amer >60 >60 mL/min   Anion gap 11 5 - 15    Comment: Performed at [facility], [place] [address] [place], [place] [address]  Protime-INR     Status: None   Collection Time: 02/22/19  2:38 PM  Result Value Ref Range   Prothrombin Time 13.6 11.4 - 15.2 seconds   INR 1.1 0.8 - 1.2    Comment: (NOTE) INR goal varies based on device and disease states. Performed at [facility], [place] [address] [place],  [address]   C-reactive protein     Status: Abnormal   Collection Time: 02/22/19  2:38 PM  Result Value Ref Range   CRP 5.5 (H) <1.0 mg/dL    Comment: Performed at [facility], [address] [place], [place] [address]      Review of Systems  Constitutional: Negative.   HENT: Negative.   Eyes: Negative.   Respiratory: Negative.   Cardiovascular: Negative.   Gastrointestinal: Negative.   Genitourinary: Negative.   Musculoskeletal:       Induration around previous incision.  Drainage from small area at the proximal 1/3 of the previous incision.  Skin: Negative.   Neurological: Negative.   Endo/Heme/Allergies: Negative.   Psychiatric/Behavioral: Positive for depression. The patient is nervous/anxious.        Physical Exam  Constitutional: He is oriented to person, place, and time. He appears well-developed.  HENT:  Head: Normocephalic.  Eyes: Pupils are equal, round, and reactive to light.  Neck: Neck supple. No JVD present. No tracheal deviation present. No thyromegaly present.  Cardiovascular: Normal rate, regular rhythm and intact distal pulses.  Respiratory: Effort normal and breath sounds normal. No respiratory distress. He has no wheezes.  GI: Soft. There is no abdominal tenderness. There is no guarding.  Musculoskeletal:     Left hip: He exhibits decreased strength, swelling and laceration (Induration around previous incision.  Drainage from small area at the proximal 1/3 of the previous incision.).  Lymphadenopathy:    He has no cervical adenopathy.  Neurological: He is alert and oriented to person, place, and time.  Skin: Skin is warm and dry.  Psychiatric: He has a normal mood and affect.       Assessment/Plan Assessment:    Infected left hip, superficial vs deep  Plan: Patient will undergo a  Left hip I&D, superficial vs deep on 02/23/2019 per Dr. [facility]lin at [facility]. Risks benefits and expectations were discussed with the patient. Patient understand risks, benefits and expectations and wishes to proceed.   Anastasio AuerbachMatthew S. Deana Krock   PA-C  02/22/2019, 4:15 PM

## 2019-02-23 ENCOUNTER — Inpatient Hospital Stay (HOSPITAL_COMMUNITY): Payer: BC Managed Care – PPO

## 2019-02-23 ENCOUNTER — Inpatient Hospital Stay: Payer: Self-pay

## 2019-02-23 ENCOUNTER — Inpatient Hospital Stay (HOSPITAL_COMMUNITY): Payer: BC Managed Care – PPO | Admitting: Anesthesiology

## 2019-02-23 ENCOUNTER — Encounter (HOSPITAL_COMMUNITY): Payer: Self-pay | Admitting: Anesthesiology

## 2019-02-23 ENCOUNTER — Encounter (HOSPITAL_COMMUNITY)
Admission: AD | Disposition: A | Discharge status: Home Health | Payer: Self-pay | Source: Other Acute Inpatient Hospital | Attending: Orthopedic Surgery

## 2019-02-23 HISTORY — PX: INCISION AND DRAINAGE: SHX5863

## 2019-02-23 LAB — HIV ANTIBODY (ROUTINE TESTING W REFLEX): HIV Screen 4th Generation wRfx: NONREACTIVE

## 2019-02-23 SURGERY — INCISION AND DRAINAGE
Anesthesia: Spinal | Site: Hip | Laterality: Left

## 2019-02-23 MED ORDER — ONDANSETRON HCL 4 MG/2ML IJ SOLN
INTRAMUSCULAR | Status: AC
  Filled 2019-02-23: qty 2

## 2019-02-23 MED ORDER — SUCCINYLCHOLINE CHLORIDE 200 MG/10ML IV SOSY
PREFILLED_SYRINGE | INTRAVENOUS | Status: AC
  Filled 2019-02-23: qty 10

## 2019-02-23 MED ORDER — FENTANYL CITRATE (PF) 100 MCG/2ML IJ SOLN
INTRAMUSCULAR | Status: DC | PRN
  Administered 2019-02-23: 100 ug via INTRAVENOUS

## 2019-02-23 MED ORDER — MIDAZOLAM HCL 2 MG/2ML IJ SOLN
INTRAMUSCULAR | Status: AC
  Filled 2019-02-23: qty 2

## 2019-02-23 MED ORDER — ALUM & MAG HYDROXIDE-SIMETH 200-200-20 MG/5ML PO SUSP: 15.0000 mL | ORAL | Status: DC | PRN

## 2019-02-23 MED ORDER — POLYETHYLENE GLYCOL 3350 17 G PO PACK
17.0000 g | PACK | Freq: Two times a day (BID) | ORAL | Status: DC
Start: 2019-02-23 — End: 2019-02-27
  Administered 2019-02-24 – 2019-02-25 (×3): 17 g via ORAL
  Filled 2019-02-23 (×6): qty 1

## 2019-02-23 MED ORDER — PROMETHAZINE HCL 25 MG/ML IJ SOLN: 6.2500 mg | INTRAMUSCULAR | Status: DC | PRN

## 2019-02-23 MED ORDER — HYDROCODONE-ACETAMINOPHEN 5-325 MG PO TABS
1.0000 | ORAL_TABLET | ORAL | Status: DC | PRN
  Administered 2019-02-23: 19:00:00 1 via ORAL
  Filled 2019-02-23: qty 1

## 2019-02-23 MED ORDER — PROPOFOL 500 MG/50ML IV EMUL
INTRAVENOUS | Status: DC | PRN
  Administered 2019-02-23: 75 ug/kg/min via INTRAVENOUS

## 2019-02-23 MED ORDER — POVIDONE-IODINE 10 % EX SOLN
Freq: Once | CUTANEOUS | Status: AC
  Administered 2019-02-23: 13:00:00 via TOPICAL
  Filled 2019-02-23: qty 118

## 2019-02-23 MED ORDER — ACETAMINOPHEN 10 MG/ML IV SOLN
1000.0000 mg | Freq: Once | INTRAVENOUS | Status: DC | PRN
  Administered 2019-02-23: 17:00:00 1000 mg via INTRAVENOUS

## 2019-02-23 MED ORDER — BUPIVACAINE IN DEXTROSE 0.75-8.25 % IT SOLN
INTRATHECAL | Status: DC | PRN
  Administered 2019-02-23: 2 mL via INTRATHECAL

## 2019-02-23 MED ORDER — BISACODYL 10 MG RE SUPP: 10.0000 mg | Freq: Every day | RECTAL | Status: DC | PRN

## 2019-02-23 MED ORDER — TRANEXAMIC ACID-NACL 1000-0.7 MG/100ML-% IV SOLN
1000.0000 mg | INTRAVENOUS | Status: AC
  Administered 2019-02-23: 15:00:00 1000 mg via INTRAVENOUS
  Filled 2019-02-23: qty 100

## 2019-02-23 MED ORDER — TRANEXAMIC ACID-NACL 1000-0.7 MG/100ML-% IV SOLN
1000.0000 mg | Freq: Once | INTRAVENOUS | Status: AC
  Administered 2019-02-23: 19:00:00 1000 mg via INTRAVENOUS
  Filled 2019-02-23: qty 100

## 2019-02-23 MED ORDER — METOCLOPRAMIDE HCL 5 MG/ML IJ SOLN: 5.0000 mg | Freq: Three times a day (TID) | INTRAMUSCULAR | Status: DC | PRN

## 2019-02-23 MED ORDER — ONDANSETRON HCL 4 MG/2ML IJ SOLN
INTRAMUSCULAR | Status: DC | PRN
  Administered 2019-02-23: 4 mg via INTRAVENOUS

## 2019-02-23 MED ORDER — PROPOFOL 10 MG/ML IV BOLUS
INTRAVENOUS | Status: AC
  Filled 2019-02-23: qty 20

## 2019-02-23 MED ORDER — VANCOMYCIN HCL IN DEXTROSE 1-5 GM/200ML-% IV SOLN
1000.0000 mg | Freq: Two times a day (BID) | INTRAVENOUS | Status: DC
  Administered 2019-02-24 – 2019-02-25 (×4): 1000 mg via INTRAVENOUS
  Filled 2019-02-23 (×5): qty 200

## 2019-02-23 MED ORDER — VANCOMYCIN HCL 10 G IV SOLR
2000.0000 mg | INTRAVENOUS | Status: AC
  Administered 2019-02-23: 19:00:00 2000 mg via INTRAVENOUS
  Filled 2019-02-23: qty 2000

## 2019-02-23 MED ORDER — FENTANYL CITRATE (PF) 100 MCG/2ML IJ SOLN
INTRAMUSCULAR | Status: AC
  Filled 2019-02-23: qty 2

## 2019-02-23 MED ORDER — MENTHOL 3 MG MT LOZG: 1.0000 | LOZENGE | OROMUCOSAL | Status: DC | PRN

## 2019-02-23 MED ORDER — MUPIROCIN 2 % EX OINT: TOPICAL_OINTMENT | Freq: Two times a day (BID) | CUTANEOUS | Status: DC

## 2019-02-23 MED ORDER — SODIUM CHLORIDE 0.9 % IV SOLN: INTRAVENOUS | Status: DC

## 2019-02-23 MED ORDER — OXYCODONE HCL 5 MG/5ML PO SOLN: 5.0000 mg | Freq: Once | ORAL | Status: DC | PRN

## 2019-02-23 MED ORDER — LACTATED RINGERS IV SOLN
INTRAVENOUS | Status: DC
  Administered 2019-02-23 (×2): via INTRAVENOUS

## 2019-02-23 MED ORDER — CELECOXIB 200 MG PO CAPS
200.0000 mg | ORAL_CAPSULE | Freq: Two times a day (BID) | ORAL | Status: DC
  Administered 2019-02-23 – 2019-02-26 (×7): 200 mg via ORAL
  Filled 2019-02-23 (×7): qty 1

## 2019-02-23 MED ORDER — MIDAZOLAM HCL 5 MG/5ML IJ SOLN
INTRAMUSCULAR | Status: DC | PRN
  Administered 2019-02-23: 2 mg via INTRAVENOUS

## 2019-02-23 MED ORDER — PHENOL 1.4 % MT LIQD: 1.0000 | OROMUCOSAL | Status: DC | PRN

## 2019-02-23 MED ORDER — HYDROMORPHONE HCL 1 MG/ML IJ SOLN
INTRAMUSCULAR | Status: AC
  Administered 2019-02-23: 17:00:00 0.25 mg via INTRAVENOUS
  Filled 2019-02-23: qty 1

## 2019-02-23 MED ORDER — OXYCODONE HCL 5 MG PO TABS: 5.0000 mg | ORAL_TABLET | Freq: Once | ORAL | Status: DC | PRN

## 2019-02-23 MED ORDER — MAGNESIUM CITRATE PO SOLN: 1.0000 | Freq: Once | ORAL | Status: DC | PRN

## 2019-02-23 MED ORDER — DEXAMETHASONE SODIUM PHOSPHATE 10 MG/ML IJ SOLN
INTRAMUSCULAR | Status: DC | PRN
  Administered 2019-02-23: 10 mg via INTRAVENOUS

## 2019-02-23 MED ORDER — DEXAMETHASONE SODIUM PHOSPHATE 10 MG/ML IJ SOLN
10.0000 mg | Freq: Once | INTRAMUSCULAR | Status: AC
Start: 2019-02-24 — End: 2019-02-24
  Administered 2019-02-24: 09:00:00 10 mg via INTRAVENOUS
  Filled 2019-02-23: qty 1

## 2019-02-23 MED ORDER — LIDOCAINE 2% (20 MG/ML) 5 ML SYRINGE
INTRAMUSCULAR | Status: AC
  Filled 2019-02-23: qty 5

## 2019-02-23 MED ORDER — DEXAMETHASONE SODIUM PHOSPHATE 10 MG/ML IJ SOLN
INTRAMUSCULAR | Status: AC
  Filled 2019-02-23: qty 1

## 2019-02-23 MED ORDER — TRANEXAMIC ACID-NACL 1000-0.7 MG/100ML-% IV SOLN
INTRAVENOUS | Status: AC
  Filled 2019-02-23: qty 100

## 2019-02-23 MED ORDER — HYDROMORPHONE HCL 1 MG/ML IJ SOLN
0.5000 mg | INTRAMUSCULAR | Status: DC | PRN
  Administered 2019-02-23: 22:00:00 1 mg via INTRAVENOUS
  Filled 2019-02-23: qty 1

## 2019-02-23 MED ORDER — HYDROMORPHONE HCL 1 MG/ML IJ SOLN
INTRAMUSCULAR | Status: AC
  Administered 2019-02-23: 17:00:00 0.5 mg via INTRAVENOUS
  Filled 2019-02-23: qty 1

## 2019-02-23 MED ORDER — ASPIRIN 81 MG PO CHEW
81.0000 mg | CHEWABLE_TABLET | Freq: Two times a day (BID) | ORAL | Status: DC
  Administered 2019-02-23 – 2019-02-26 (×7): 81 mg via ORAL
  Filled 2019-02-23 (×7): qty 1

## 2019-02-23 MED ORDER — PROPOFOL 10 MG/ML IV BOLUS
INTRAVENOUS | Status: DC | PRN
  Administered 2019-02-23 (×4): 20 mg via INTRAVENOUS

## 2019-02-23 MED ORDER — DOCUSATE SODIUM 100 MG PO CAPS
100.0000 mg | ORAL_CAPSULE | Freq: Two times a day (BID) | ORAL | Status: DC
  Administered 2019-02-23 – 2019-02-26 (×5): 100 mg via ORAL
  Filled 2019-02-23 (×6): qty 1

## 2019-02-23 MED ORDER — SODIUM CHLORIDE 0.9 % IV SOLN
INTRAVENOUS | Status: AC
  Filled 2019-02-23: qty 500000

## 2019-02-23 MED ORDER — PROPOFOL 10 MG/ML IV BOLUS
INTRAVENOUS | Status: AC
  Filled 2019-02-23: qty 40

## 2019-02-23 MED ORDER — METOCLOPRAMIDE HCL 5 MG PO TABS: 5.0000 mg | ORAL_TABLET | Freq: Three times a day (TID) | ORAL | Status: DC | PRN

## 2019-02-23 MED ORDER — SODIUM CHLORIDE 0.9 % IR SOLN
Status: DC | PRN
  Administered 2019-02-23: 4000 mL

## 2019-02-23 MED ORDER — ACETAMINOPHEN 10 MG/ML IV SOLN
INTRAVENOUS | Status: AC
  Administered 2019-02-23: 17:00:00 1000 mg via INTRAVENOUS
  Filled 2019-02-23: qty 100

## 2019-02-23 MED ORDER — HYDROMORPHONE HCL 1 MG/ML IJ SOLN
0.2500 mg | INTRAMUSCULAR | Status: DC | PRN
  Administered 2019-02-23: 17:00:00 0.25 mg via INTRAVENOUS
  Administered 2019-02-23: 0.5 mg via INTRAVENOUS
  Administered 2019-02-23: 0.25 mg via INTRAVENOUS

## 2019-02-23 SURGICAL SUPPLY — 44 items
BAG ZIPLOCK 12X15 (MISCELLANEOUS) IMPLANT
BLADE SAW SGTL 11.0X1.19X90.0M (BLADE) IMPLANT
CLOTH BEACON ORANGE TIMEOUT ST (SAFETY) ×3 IMPLANT
COVER PERINEAL POST (MISCELLANEOUS) ×3 IMPLANT
COVER SURGICAL LIGHT HANDLE (MISCELLANEOUS) ×3 IMPLANT
COVER WAND RF STERILE (DRAPES) IMPLANT
DERMABOND ADVANCED (GAUZE/BANDAGES/DRESSINGS) ×2
DERMABOND ADVANCED .7 DNX12 (GAUZE/BANDAGES/DRESSINGS) ×1 IMPLANT
DRAPE STERI IOBAN 125X83 (DRAPES) ×3 IMPLANT
DRAPE U-SHAPE 47X51 STRL (DRAPES) ×6 IMPLANT
DRESSING AQUACEL AG SP 3.5X10 (GAUZE/BANDAGES/DRESSINGS) IMPLANT
DRSG AQUACEL AG ADV 3.5X10 (GAUZE/BANDAGES/DRESSINGS) ×3 IMPLANT
DRSG AQUACEL AG SP 3.5X10 (GAUZE/BANDAGES/DRESSINGS) ×3
ELECT REM PT RETURN 15FT ADLT (MISCELLANEOUS) ×3 IMPLANT
GLOVE BIOGEL PI IND STRL 7.5 (GLOVE) ×1 IMPLANT
GLOVE BIOGEL PI IND STRL 8.5 (GLOVE) ×1 IMPLANT
GLOVE BIOGEL PI INDICATOR 7.5 (GLOVE) ×2
GLOVE BIOGEL PI INDICATOR 8.5 (GLOVE) ×2
GLOVE ECLIPSE 8.0 STRL XLNG CF (GLOVE) ×3 IMPLANT
GLOVE ORTHO TXT STRL SZ7.5 (GLOVE) ×4 IMPLANT
GOWN STRL REUS W/TWL LRG LVL3 (GOWN DISPOSABLE) ×4 IMPLANT
GOWN STRL REUS W/TWL XL LVL3 (GOWN DISPOSABLE) ×3 IMPLANT
HEAD CERAMIC DELTA 36 PLUS 1.5 (Hips) ×2 IMPLANT
HOLDER FOLEY CATH W/STRAP (MISCELLANEOUS) ×3 IMPLANT
JET LAVAGE IRRISEPT WOUND (IRRIGATION / IRRIGATOR) ×3
KIT TURNOVER KIT A (KITS) IMPLANT
LAVAGE JET IRRISEPT WOUND (IRRIGATION / IRRIGATOR) IMPLANT
PACK ANTERIOR HIP CUSTOM (KITS) ×3 IMPLANT
PINNACLE ALTRX PLUS 4 N 36X56 (Hips) ×2 IMPLANT
SAW OSC TIP CART 19.5X105X1.3 (SAW) ×1 IMPLANT
SOL PREP PROV IODINE SCRUB 4OZ (MISCELLANEOUS) ×2 IMPLANT
SUCTION FRAZIER HANDLE 12FR (TUBING) ×2
SUCTION TUBE FRAZIER 12FR DISP (TUBING) IMPLANT
SUT MNCRL AB 3-0 PS2 18 (SUTURE) ×2 IMPLANT
SUT MNCRL AB 4-0 PS2 18 (SUTURE) ×1 IMPLANT
SUT STRATAFIX PDS+ 0 24IN (SUTURE) ×3 IMPLANT
SUT VIC AB 1 CT1 36 (SUTURE) ×9 IMPLANT
SUT VIC AB 2-0 CT1 27 (SUTURE) ×6
SUT VIC AB 2-0 CT1 TAPERPNT 27 (SUTURE) ×2 IMPLANT
SWAB COLLECTION DEVICE MRSA (MISCELLANEOUS) ×2 IMPLANT
SWAB CULTURE ESWAB REG 1ML (MISCELLANEOUS) ×2 IMPLANT
SYR 20CC LL (SYRINGE) ×2 IMPLANT
TRAY FOLEY MTR SLVR 16FR STAT (SET/KITS/TRAYS/PACK) ×3 IMPLANT
WATER STERILE IRR 1000ML POUR (IV SOLUTION) ×3 IMPLANT

## 2019-02-23 NOTE — Plan of Care (Signed)
Plan of care reviewed and discussed with the patient. 

## 2019-02-23 NOTE — Anesthesia Procedure Notes (Signed)
Date/Time: 02/23/2019 2:29 PM Performed by: Sharlette Dense, CRNA Oxygen Delivery Method: Simple face mask

## 2019-02-23 NOTE — Interval H&P Note (Signed)
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  The various methods of treatment have been discussed with the patient and family. After consideration of risks, benefits and other options for treatment, the patient has consented to  Procedure(s): INCISION AND DRAINAGE SUPERFICIAL VS DEEP LEFT HIP (Left) as a surgical intervention.  The patient's history has been reviewed, patient examined, no change in status, stable for surgery.  I have reviewed the patient's chart and labs.  Questions were answered to the patient's satisfaction.     Laya Letendre D Tenlee Wollin   

## 2019-02-23 NOTE — H&P (View-Only) (Signed)
Patient ID: Cole Gill, male   DOB: 03/10/1966, 53 y.o.   MRN: [id number]  Acute left hip infection.  He had been doing very well until Friday 7/10 when he noted redness and some drainage. Reports onset of sinus issue (?infection given history of them) the day or two before.  Comfortable now  Left hip with purulent drainage  ESR - 49 CRP - 5.5  Assessment: Acute left hip infection after THR  Plan: NPO To OR today for open I&D with head and liner revision PIC line ID consult 

## 2019-02-23 NOTE — Anesthesia Preprocedure Evaluation (Addendum)
Anesthesia Evaluation  Patient identified by MRN, date of birth, ID band Patient awake    Reviewed: Allergy & Precautions, NPO status , Patient's Chart, lab work & pertinent test results  History of Anesthesia Complications Negative for: history of anesthetic complications  Airway Mallampati: II  TM Distance: >3 FB Neck ROM: Full    Dental no notable dental hx.    Pulmonary sleep apnea and Continuous Positive Airway Pressure Ventilation ,    Pulmonary exam normal        Cardiovascular hypertension, Pt. on medications Normal cardiovascular exam     Neuro/Psych Anxiety Depression negative neurological ROS     GI/Hepatic negative GI ROS, Neg liver ROS,   Endo/Other  negative endocrine ROS  Renal/GU negative Renal ROS     Musculoskeletal  (+) Arthritis , Left hip PJI   Abdominal   Peds  Hematology negative hematology ROS (+)   Anesthesia Other Findings Day of surgery medications reviewed with the patient.  Reproductive/Obstetrics                            Anesthesia Physical Anesthesia Plan  ASA: II  Anesthesia Plan: Spinal   Post-op Pain Management:    Induction:   PONV Risk Score and Plan: 2 and Treatment may vary due to age or medical condition, Ondansetron, Propofol infusion, Dexamethasone and Midazolam  Airway Management Planned: Natural Airway and Simple Face Mask  Additional Equipment:   Intra-op Plan:   Post-operative Plan:   Informed Consent: I have reviewed the patients History and Physical, chart, labs and discussed the procedure including the risks, benefits and alternatives for the proposed anesthesia with the patient or authorized representative who has indicated his/her understanding and acceptance.     Dental advisory given  Plan Discussed with: CRNA  Anesthesia Plan Comments:        Anesthesia Quick Evaluation

## 2019-02-23 NOTE — Progress Notes (Signed)
Pharmacy Antibiotic Note  Cole Gill is a 53 y.o. male admitted on 02/22/2019 with infection of left total hip arthroplasty.  Pharmacy has been consulted for vancomycin dosing.  Patient had left total hip arthroplasty on 01/26/19 and has been complaining of left hip pain for several days. Recently prescribed antibiotics outpatient. Underwent I&D on 02/23/19.  Today, 02/23/19  Afebrile  WBC 5.6 on 7/13  SCr 1.06 on 7/13, CrCl ~100 mL/min  Sed rate (49), CRP (5.5)  Plan:  Vancomycin 2000 mg LD followed by 1000 mg IV q12h  Goal AUC 400-550  Follow culture data  Check SCr daily and follow renal function closely  Check vancomycin levels once at steady state if indicated  Height: 5\' 11"  (180.3 cm) Weight: 235 lb 14.3 oz (107 kg) IBW/kg (Calculated) : 75.3  Temp (24hrs), Avg:98.1 F (36.7 C), Min:97.7 F (36.5 C), Max:98.7 F (37.1 C)  Recent Labs  Lab 02/22/19 1438  WBC 5.6  CREATININE 1.06    Estimated Creatinine Clearance: 98 mL/min (by C-G formula based on SCr of 1.06 mg/dL).    No Known Allergies  Antimicrobials this admission: vancomycin 7/14 >>  cefazolin 7/13 >> 7/14  Dose adjustments this admission:   Microbiology results: 7/13 SARS-2:  Negative 7/14 Wound: Sent  Thank you for allowing pharmacy to be a part of this patient's care.  Lenis Noon, PharmD 02/23/2019 6:25 PM

## 2019-02-23 NOTE — Anesthesia Procedure Notes (Signed)
Spinal  Patient location during procedure: OR Start time: 02/23/2019 2:30 PM End time: 02/23/2019 2:36 PM Staffing Resident/CRNA: Sharlette Dense, CRNA Performed: resident/CRNA  Preanesthetic Checklist Completed: patient identified, site marked, surgical consent, pre-op evaluation, timeout performed, IV checked, risks and benefits discussed and monitors and equipment checked Spinal Block Patient position: sitting Prep: site prepped and draped and DuraPrep Patient monitoring: heart rate, continuous pulse ox and blood pressure Approach: midline Location: L4-5 Injection technique: single-shot Needle Needle type: Sprotte  Needle gauge: 24 G Needle length: 9 cm Additional Notes Kit expiration date 06/12/2019 and lot #[id number] Clear free flow CSF, negative paresthesia Tolerated well and returned to supine position

## 2019-02-23 NOTE — Plan of Care (Signed)
  Problem: Health Behavior/Discharge Planning: Goal: Ability to manage health-related needs will improve Outcome: Progressing   Problem: Clinical Measurements: Goal: Ability to maintain clinical measurements within normal limits will improve Outcome: Progressing Goal: Will remain free from infection Outcome: Progressing Goal: Diagnostic test results will improve Outcome: Progressing Goal: Cardiovascular complication will be avoided Outcome: Progressing   

## 2019-02-23 NOTE — Progress Notes (Signed)
Patient ID: Cole Gill, male   DOB: 07/18/1966, 53 y.o.   MRN: [id number]  Acute left hip infection.  He had been doing very well until Friday 7/10 when he noted redness and some drainage. Reports onset of sinus issue (?infection given history of them) the day or two before.  Comfortable now  Left hip with purulent drainage  ESR - 49 CRP - 5.5  Assessment: Acute left hip infection after THR  Plan: NPO To OR today for open I&D with head and liner revision PIC line ID consult 

## 2019-02-23 NOTE — Transfer of Care (Signed)
Immediate Anesthesia Transfer of Care Note  Patient: Cole Gill  Procedure(s) Performed: INCISION AND DRAINAGE DEEP LEFT HIP, HEAD BALL AND LINER EXCHANGE (Left Hip)  Patient Location: PACU  Anesthesia Type:Spinal  Level of Consciousness: drowsy  Airway & Oxygen Therapy: Patient Spontanous Breathing and Patient connected to face mask oxygen  Post-op Assessment: Report given to RN and Post -op Vital signs reviewed and stable  Post vital signs: Reviewed and stable  Last Vitals:  Vitals Value Taken Time  BP    Temp    Pulse 65 02/23/19 1648  Resp 10 02/23/19 1648  SpO2 99 % 02/23/19 1648  Vitals shown include unvalidated device data.  Last Pain:  Vitals:   02/23/19 1324  TempSrc: Oral  PainSc:       Patients Stated Pain Goal: 1 (25/95/63 8756)  Complications: No apparent anesthesia complications

## 2019-02-23 NOTE — Interval H&P Note (Signed)
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  The various methods of treatment have been discussed with the patient and family. After consideration of risks, benefits and other options for treatment, the patient has consented to  Procedure(s): INCISION AND DRAINAGE SUPERFICIAL VS DEEP LEFT HIP (Left) as a surgical intervention.  The patient's history has been reviewed, patient examined, no change in status, stable for surgery.  I have reviewed the patient's chart and labs.  Questions were answered to the patient's satisfaction.     [name]

## 2019-02-23 NOTE — Anesthesia Postprocedure Evaluation (Signed)
Anesthesia Post Note  Patient: Sederick Jacobsen  Procedure(s) Performed: INCISION AND DRAINAGE DEEP LEFT HIP, HEAD BALL AND LINER EXCHANGE (Left Hip)     Patient location during evaluation: PACU Anesthesia Type: Spinal Level of consciousness: awake and alert Pain management: pain level controlled Vital Signs Assessment: post-procedure vital signs reviewed and stable Respiratory status: spontaneous breathing, nonlabored ventilation and respiratory function stable Cardiovascular status: blood pressure returned to baseline and stable Postop Assessment: no apparent nausea or vomiting and spinal receding Anesthetic complications: no    Last Vitals:  Vitals:   02/23/19 1803 02/23/19 1856  BP: 136/69 132/80  Pulse: (!) 51 (!) 59  Resp: 16 16  Temp:    SpO2: 98% 98%    Last Pain:  Vitals:   02/23/19 1730  TempSrc:   PainSc: 0-No pain                 Brennan Bailey

## 2019-02-24 ENCOUNTER — Encounter (HOSPITAL_COMMUNITY): Payer: Self-pay | Admitting: Orthopedic Surgery

## 2019-02-24 LAB — BASIC METABOLIC PANEL
Anion gap: 10 (ref 5–15)
BUN: 18 mg/dL (ref 6–20)
CO2: 25 mmol/L (ref 22–32)
Calcium: 8.6 mg/dL — ABNORMAL LOW (ref 8.9–10.3)
Chloride: 102 mmol/L (ref 98–111)
Creatinine, Ser: 0.88 mg/dL (ref 0.61–1.24)
GFR calc Af Amer: >60 mL/min (ref >60)
GFR calc non Af Amer: >60 mL/min (ref >60)
Glucose, Bld: 140 mg/dL — ABNORMAL HIGH (ref 70–99)
Potassium: 4.6 mmol/L (ref 3.5–5.1)
Sodium: 137 mmol/L (ref 135–145)

## 2019-02-24 LAB — CBC
HCT: 34.7 % — ABNORMAL LOW (ref 39.0–52.0)
Hemoglobin: 11.5 g/dL — ABNORMAL LOW (ref 13.0–17.0)
MCH: 31.2 pg (ref 26.0–34.0)
MCHC: 33.1 g/dL (ref 30.0–36.0)
MCV: 94 fL (ref 80.0–100.0)
Platelets: 353 10*3/uL (ref 150–400)
RBC: 3.69 MIL/uL — ABNORMAL LOW (ref 4.22–5.81)
RDW: 12.1 % (ref 11.5–15.5)
WBC: 7.2 10*3/uL (ref 4.0–10.5)
nRBC: 0 % (ref 0.0–0.2)

## 2019-02-24 MED ORDER — SODIUM CHLORIDE 0.9% FLUSH: 10.0000 mL | Freq: Two times a day (BID) | INTRAVENOUS | Status: DC

## 2019-02-24 MED ORDER — CEFAZOLIN SODIUM-DEXTROSE 2-4 GM/100ML-% IV SOLN
2.0000 g | Freq: Three times a day (TID) | INTRAVENOUS | Status: DC
  Administered 2019-02-24 – 2019-02-26 (×6): 2 g via INTRAVENOUS
  Filled 2019-02-24 (×5): qty 100

## 2019-02-24 MED ORDER — SODIUM CHLORIDE 0.9% FLUSH: 10.0000 mL | INTRAVENOUS | Status: DC | PRN

## 2019-02-24 MED ORDER — SODIUM CHLORIDE 0.9 % IV SOLN: 300.0000 mg | Freq: Two times a day (BID) | INTRAVENOUS | Status: DC

## 2019-02-24 MED ORDER — RIFAMPIN 300 MG PO CAPS
300.0000 mg | ORAL_CAPSULE | Freq: Two times a day (BID) | ORAL | Status: DC
  Administered 2019-02-24 – 2019-02-26 (×6): 300 mg via ORAL
  Filled 2019-02-24: qty 5
  Filled 2019-02-24 (×6): qty 1
  Filled 2019-02-24: qty 5
  Filled 2019-02-24 (×4): qty 1

## 2019-02-24 MED ORDER — HYDROMORPHONE HCL 2 MG PO TABS
2.0000 mg | ORAL_TABLET | Freq: Four times a day (QID) | ORAL | Status: DC | PRN
  Administered 2019-02-24 – 2019-02-25 (×4): 2 mg via ORAL
  Filled 2019-02-24 (×5): qty 1

## 2019-02-24 NOTE — Plan of Care (Signed)
Plan of care reviewed and discussed with the patient. 

## 2019-02-24 NOTE — Progress Notes (Signed)
Pharmacy Antibiotic Note  Cole Gill is a 53 y.o. male admitted on 02/22/2019 with infection of left total hip arthroplasty.  Pharmacy has been consulted for Vancomycin dosing.  Patient had left total hip arthroplasty on 01/26/19 and has been complaining of left hip pain for several days. Recently prescribed antibiotics outpatient. Underwent I&D on 02/23/19.  Patient also ordered Cefazolin and Rifampin per MD.   ID has been consulted for antibiotic recommendations.   Plan:  Continue Vancomycin 1g IV q12h  Plan for Vancomycin levels at steady state, as indicated  Adjust Cefazolin to 2g IV q8h  Rifampin 300mg  PO BID per MD  Monitor renal function, cultures, clinical course, ID recommendations  Height: 5\' 11"  (180.3 cm) Weight: 235 lb 14.3 oz (107 kg) IBW/kg (Calculated) : 75.3  Temp (24hrs), Avg:97.8 F (36.6 C), Min:96.9 F (36.1 C), Max:98.7 F (37.1 C)  Recent Labs  Lab 02/22/19 1438 02/24/19 0322  WBC 5.6 7.2  CREATININE 1.06 0.88    Estimated Creatinine Clearance: 118.1 mL/min (by C-G formula based on SCr of 0.88 mg/dL).    No Known Allergies  Antimicrobials this admission: 7/13 Cefazolin >>  7/14 Vancomycin >> 7/15 Rifampin >>  Microbiology results: 7/13 COVID: negative 7/14 (at 1512) left hip synovial fluid: NGTD 7/14 (at 2316) left hip synovial fluid: few GPC on gram stain, cx pending    Thank you for allowing pharmacy to be a part of this patient's care.   Lindell Spar, PharmD, BCPS Pager: [phone] 02/24/2019 3:47 PM

## 2019-02-24 NOTE — Evaluation (Signed)
Physical Therapy Evaluation Patient Details Name: Cole Gill MRN: [id number] DOB: 02-Apr-1966 Today's Date: 02/24/2019   History of Present Illness  53 yo male s/p L THA-direct anterior 01/26/19 admitted due to infection now s/p I&D with new head and liner . Hx of R THA-DA  Clinical Impression  Patient presents with decreased mobility mainly due to pain, decreased AROM and decreased strength.  He will benefit from skilled PT in the acute setting to allow return home with family support.      Follow Up Recommendations Follow surgeon's recommendation for DC plan and follow-up therapies    Equipment Recommendations  None recommended by PT    Recommendations for Other Services       Precautions / Restrictions Precautions Precautions: Fall Restrictions Other Position/Activity Restrictions: WBAT      Mobility  Bed Mobility Overal bed mobility: (P) Needs Assistance Bed Mobility: (P) Supine to Sit     Supine to sit: (P) Supervision     General bed mobility comments: (P) close guard for safety. increased time.  Transfers Overall transfer level: (P) Needs assistance Equipment used: (P) Rolling walker (2 wheeled) Transfers: (P) Sit to/from Stand Sit to Stand: (P) Min guard         General transfer comment: (P) able to demonstrate safe technique  Ambulation/Gait Ambulation/Gait assistance: (P) Supervision;Min guard Gait Distance (Feet): (P) 150 Feet Assistive device: (P) Rolling walker (2 wheeled) Gait Pattern/deviations: (P) Step-through pattern;Step-to pattern;Decreased stride length;Antalgic     General Gait Details: (P) assist for safety  Stairs Stairs: (P) Yes Stairs assistance: (P) Min guard Stair Management: (P) One rail Right;With cane Number of Stairs: (P) 10    Wheelchair Mobility    Modified Rankin (Stroke Patients Only)       Balance Overall balance assessment: Mild deficits observed, not formally tested                                            Pertinent Vitals/Pain Pain Score: 5  Pain Location: L hip Pain Descriptors / Indicators: Sore Pain Intervention(s): Monitored during session;Repositioned    Home Living Family/patient expects to be discharged to:: Private residence Living Arrangements: Spouse/significant other Available Help at Discharge: Family Type of Home: House Home Access: Stairs to enter Entrance Stairs-Rails: Right Entrance Stairs-Number of Steps: 6 Home Layout: Bed/bath upstairs;Two level Home Equipment: Walker - 2 wheels;Bedside commode      Prior Function Level of Independence: Independent               Hand Dominance        Extremity/Trunk Assessment   Upper Extremity Assessment Upper Extremity Assessment: Overall WFL for tasks assessed    Lower Extremity Assessment Lower Extremity Assessment: LLE deficits/detail LLE Deficits / Details: moves antigravity, but weak and painful       Communication   Communication: No difficulties  Cognition Arousal/Alertness: Awake/alert Behavior During Therapy: WFL for tasks assessed/performed Overall Cognitive Status: Within Functional Limits for tasks assessed                                        General Comments      Exercises Total Joint Exercises Ankle Circles/Pumps: AROM;10 reps;Supine;Both Quad Sets: AROM;Left;10 reps;Supine Short Arc Quad: AROM;Left;10 reps;Supine Heel Slides: AROM;Left;10 reps;Supine Hip ABduction/ADduction: AROM;Left;10 reps;Supine  Assessment/Plan    PT Assessment Patient needs continued PT services  PT Problem List Pain;Decreased strength;Decreased activity tolerance;Decreased balance;Decreased mobility       PT Treatment Interventions Gait training;DME instruction;Stair training;Therapeutic activities;Balance training;Therapeutic exercise;Functional mobility training;Patient/family education    PT Goals (Current goals can be found in the Care Plan section)   Acute Rehab PT Goals Patient Stated Goal: return to independent PT Goal Formulation: With patient Time For Goal Achievement: 02/28/19 Potential to Achieve Goals: Good    Frequency 7X/week   Barriers to discharge        Co-evaluation               AM-PAC PT "6 Clicks" Mobility  Outcome Measure Help needed turning from your back to your side while in a flat bed without using bedrails?: A Little Help needed moving from lying on your back to sitting on the side of a flat bed without using bedrails?: A Little Help needed moving to and from a bed to a chair (including a wheelchair)?: A Little Help needed standing up from a chair using your arms (e.g., wheelchair or bedside chair)?: None Help needed to walk in hospital room?: A Little Help needed climbing 3-5 steps with a railing? : A Little 6 Click Score: 19    End of Session Equipment Utilized During Treatment: Gait belt Activity Tolerance: Patient tolerated treatment well Patient left: in bed;with call bell/phone within reach   PT Visit Diagnosis: Other abnormalities of gait and mobility (R26.89)    Time: 1105-1140 PT Time Calculation (min) (ACUTE ONLY): 35 min   Charges:   PT Evaluation $PT Eval Low Complexity: 1 Low PT Treatments $Gait Training: 8-22 mins        Magda Kiel, [place] [phone] 02/24/2019   Reginia Naas 02/24/2019, 2:25 PM

## 2019-02-24 NOTE — Progress Notes (Signed)
     Subjective: 1 Day Post-Op Procedure(s) (LRB): INCISION AND DRAINAGE DEEP LEFT HIP, HEAD BALL AND LINER EXCHANGE (Left)   [name] / moderate.  States that he has itching with the medication, discussed changing analgesic meds. Otherwise no events throughout the night.    Objective:   VITALS:   Vitals:   02/24/19 0455 02/24/19 0851  BP: 125/70 130/78  Pulse: 62 72  Resp: 16 16  Temp: 97.6 F (36.4 C) 98.4 F (36.9 C)  SpO2: 96% 97%    Dorsiflexion/Plantar flexion intact Incision: dressing C/D/I No cellulitis present Compartment soft  LABS Recent Labs    02/22/19 1438 02/24/19 0322  HGB 13.7 11.5*  HCT 41.6 34.7*  WBC 5.6 7.2  PLT 358 353    Recent Labs    02/22/19 1438 02/24/19 0322  NA 142 137  K 4.1 4.6  BUN 15 18  CREATININE 1.06 0.88  GLUCOSE 99 140*     Assessment/Plan: 1 Day Post-Op Procedure(s) (LRB): INCISION AND DRAINAGE DEEP LEFT HIP, HEAD BALL AND LINER EXCHANGE (Left) Changed from Norco to Dilaudid, he did well with Dilaudid IV yesterday ID Consulted placed Foley cath d/c'ed Advance diet Up with therapy D/C IV fluids Discharge home eventually when ready      [place] Pugh. Lameshia Hypolite   PAC  02/24/2019, 9:11 AM

## 2019-02-24 NOTE — Progress Notes (Signed)
Peripherally Inserted Central Catheter/Midline Placement  The IV Nurse has discussed with the patient and/or persons authorized to consent for the patient, the purpose of this procedure and the potential benefits and risks involved with this procedure.  The benefits include less needle sticks, lab draws from the catheter, and the patient may be discharged home with the catheter. Risks include, but not limited to, infection, bleeding, blood clot (thrombus formation), and puncture of an artery; nerve damage and irregular heartbeat and possibility to perform a PICC exchange if needed/ordered by physician.  Alternatives to this procedure were also discussed.  Bard Power PICC patient education guide, fact sheet on infection prevention and patient information card has been provided to patient /or left at bedside.    PICC/Midline Placement Documentation  PICC Single Lumen 85/02/77 PICC Left Cephalic 47 cm 0 cm (Active)  Indication for Insertion or Continuance of Line Home intravenous therapies (PICC only) 02/24/19 1234  Exposed Catheter (cm) 0 cm 02/24/19 1234  Site Assessment Clean;Dry;Intact 02/24/19 1234  Line Status Flushed;Saline locked;Blood return noted 02/24/19 1234  Dressing Type Transparent 02/24/19 1234  Dressing Status Clean;Dry;Intact;Antimicrobial disc in place 02/24/19 1234  Dressing Change Due 03/03/19 02/24/19 1234       Cole Gill 02/24/2019, 12:35 PM

## 2019-02-24 NOTE — Op Note (Signed)
NAME: Cole Gill, FESTER MEDICAL RECORD TD:[id number] ACCOUNT [id number] DATE OF BIRTH:September 17, 1965 FACILITY: [facility] LOCATION: [facility] PHYSICIAN:Caedyn Raygoza Marian Sorrow, MD  OPERATIVE REPORT  DATE OF PROCEDURE:  02/24/2019  PREOPERATIVE DIAGNOSIS:  Acute left hip infection, status post total hip arthroplasty.  POSTOPERATIVE DIAGNOSIS:  Acute left hip infection, status post total hip arthroplasty.  PROCEDURE:   1.  Open excisional and non-excisional debridement of left hip including excisional debridement of a scar that was 6 inches long including skin, subcutaneous tissue, nonviable tissue sharply with a scalpel and Bovie as well as nonexcisional debridement  with 6 liters normal saline solution and 500 mL of Irrisept antibacterial solution.  2.  Revision of left hip components including the polyethylene insert and the ceramic ball 36+1.5.  SURGEON:  Paralee Cancel, MD  ASSISTANT:  Danae Orleans PA-C.  Note that Mr. Cole Gill was present for the entirety of the procedure from preoperative positioning, perioperative management of the operative extremity, general facilitation of the case and primary wound closure.  ANESTHESIA:  Spinal.  SPECIMENS:  Left hip joint fluid sent, both superficial and deep to Pathology for Gram stain analysis, culture.  DRAINS:  None.  COMPLICATIONS:  None.  BLOOD LOSS:  400 mL.  INDICATIONS:  The patient is a very pleasant 53 year old male who had a left total hip replacement being performed in the middle of June of 2020.  He was seen in the office at his 2-week visit, doing well, progressed off the cane and he had been doing  very well until Friday prior to admission.  This would be Friday the 10th.  He noticed some redness around his incision.  There was no drainage.  Upon further observation, they started to notice drainage on Sunday.  He was seen in the emergency room and  transferred [facility] to [facility].  He was seen and evaluated.  He was noted  to have an elevated sedimentation rate and C-reactive protein as well as erythema around his incision as well as drainage.  He was scheduled for operative  management.  Risks and benefits and necessity of the procedure were discussed.  The postoperative course discussed including antibiotic use short term and long term.  Consent was obtained for management.  DESCRIPTION OF PROCEDURE:  The patient was brought to the operative theater.  Once adequate anesthesia, preoperative antibiotics had been given already due to the presentation and drainage.  This was Ancef.  He was positioned safely on the Hana table  with all bony prominences protected.  The left hip was predraped.  No fluoroscopy was utilized.  The left hip was then prepped and draped in sterile fashion.  A timeout was performed identifying the patient, the planned procedure and extremity.   Following this, his old incision was excised.  Soft tissue debridement was carried down to the tensor fascia.  This layer was recreated and then incised.  I did see that there was a lot of purulence, more in the superficial layer initially.  I did not  feel any direct palpation, but due to the proximity of his hip joint.  Wound was impossible to say that it was isolated to the superficial wound.  I did irrigate this portion of the wound out with about 500 mL of pulse lavage before entering the fascia,   Upon entering the fascia,  I did not see a big rush of purulence, but did have some seroma, which was evacuated with a syringe to send a pathology.  I then worked on  exposure of the joint.  With retractors utilization I performed a capsulectomy sharply  excised using a nonviable soft tissues as well as the capsule to get to the hip joint.  With the hip exposed, it did take a bit of time to dislodge the femoral head from the trunnion, but I was able to do this with the leg in an extended and abducted  position and the head was removed.  I also then with the head off  was able to expose the acetabulum and removed the acetabular liner.  Once this was done, we irrigated the hip with the remaining 3 liters normal saline solution.  I followed this with 500  mL of Irrisept antibacterial/chlorhexidine solution.  We let this sit in the wound for 5 minutes.  I then replaced the acetabular liner, which was a 36+4 neutral Altrx liner and a 36+1.5 delta ceramic ball.  The hip was reduced.  We reirrigated the hip  on the way out with another 3 liters.  We then reapproximated the fascia of the tensor fascia lata muscle with #1 Vicryl and a running Stratafix suture albeit in a little bit of an abnormal scarred in layer.  The remaining wound was closed routinely with  3-0 Vicryl and a running Monocryl stitch.  The hip was then cleaned, dried and dressed sterilely using surgical glue and Aquacel dressing.    He was brought to the recovery room in stable condition, tolerating the procedure well. We will consult Infectious Disease for direction and management.    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  He will be placed on vancomycin and rifampin until further directed by Infectious Disease.  AN/NUANCE  D:02/24/2019 T:02/24/2019 JOB:[id number]

## 2019-02-24 NOTE — Brief Op Note (Signed)
02/23/2019  11:25 AM  PATIENT:  Cole Gill  53 y.o. male  PRE-OPERATIVE DIAGNOSIS:  Left hip prosthetic hip acute infection  POST-OPERATIVE DIAGNOSIS:  Left hip prosthetic hip infection superficial vs deep  PROCEDURE:  Procedure(s): INCISION AND DRAINAGE DEEP LEFT HIP, HEAD BALL AND LINER EXCHANGE (Left)  SURGEON:  Surgeon(s) and Role:    Paralee Cancel, MD - Primary  PHYSICIAN ASSISTANT: Danae Orleans, PA-C   ANESTHESIA:   spinal  EBL:  450 mL   BLOOD ADMINISTERED:none  DRAINS: none   LOCAL MEDICATIONS USED:  NONE  SPECIMEN:  Source of Specimen:  left hip synovial fluid  DISPOSITION OF SPECIMEN:  PATHOLOGY  COUNTS:  YES  TOURNIQUET:  * No tourniquets in log *  DICTATION: .Other Dictation: Dictation Number [id number]  PLAN OF CARE: Admit to inpatient   PATIENT DISPOSITION:  PACU - hemodynamically stable.   Delay start of Pharmacological VTE agent (>24hrs) due to surgical blood loss or risk of bleeding: no

## 2019-02-24 NOTE — Plan of Care (Signed)
  Problem: Health Behavior/Discharge Planning: Goal: Ability to manage health-related needs will improve 02/24/2019 0815 by Hubert Azure, RN Outcome: Progressing 02/23/2019 1935 by Hubert Azure, RN Outcome: Progressing   Problem: Clinical Measurements: Goal: Ability to maintain clinical measurements within normal limits will improve 02/24/2019 0815 by Hubert Azure, RN Outcome: Progressing 02/23/2019 1935 by Hubert Azure, RN Outcome: Progressing Goal: Will remain free from infection 02/24/2019 0815 by Hubert Azure, RN Outcome: Progressing 02/23/2019 1935 by Hubert Azure, RN Outcome: Progressing Goal: Diagnostic test results will improve 02/24/2019 0815 by Hubert Azure, RN Outcome: Progressing 02/23/2019 1935 by Hubert Azure, RN Outcome: Progressing Goal: Cardiovascular complication will be avoided 02/24/2019 0815 by Hubert Azure, RN Outcome: Progressing 02/23/2019 1935 by Hubert Azure, RN Outcome: Progressing   Problem: Nutrition: Goal: Adequate nutrition will be maintained Outcome: Progressing

## 2019-02-25 LAB — VANCOMYCIN, PEAK: Vancomycin Pk: 29 ug/mL — ABNORMAL LOW (ref 30–40)

## 2019-02-25 LAB — BASIC METABOLIC PANEL
Anion gap: 9 (ref 5–15)
BUN: 22 mg/dL — ABNORMAL HIGH (ref 6–20)
CO2: 26 mmol/L (ref 22–32)
Calcium: 8.5 mg/dL — ABNORMAL LOW (ref 8.9–10.3)
Chloride: 101 mmol/L (ref 98–111)
Creatinine, Ser: 1.06 mg/dL (ref 0.61–1.24)
GFR calc Af Amer: >60 mL/min (ref >60)
GFR calc non Af Amer: >60 mL/min (ref >60)
Glucose, Bld: 122 mg/dL — ABNORMAL HIGH (ref 70–99)
Potassium: 4.2 mmol/L (ref 3.5–5.1)
Sodium: 136 mmol/L (ref 135–145)

## 2019-02-25 LAB — CBC
HCT: 30.6 % — ABNORMAL LOW (ref 39.0–52.0)
Hemoglobin: 9.9 g/dL — ABNORMAL LOW (ref 13.0–17.0)
MCH: 30.5 pg (ref 26.0–34.0)
MCHC: 32.4 g/dL (ref 30.0–36.0)
MCV: 94.2 fL (ref 80.0–100.0)
Platelets: 342 10*3/uL (ref 150–400)
RBC: 3.25 MIL/uL — ABNORMAL LOW (ref 4.22–5.81)
RDW: 12.2 % (ref 11.5–15.5)
WBC: 11.2 10*3/uL — ABNORMAL HIGH (ref 4.0–10.5)
nRBC: 0 % (ref 0.0–0.2)

## 2019-02-25 NOTE — TOC Initial Note (Signed)
Transition of Care [facility](TOC) - Initial/Assessment Note    Patient Details  Name: Cole Gill MRN: [id number] Date of Birth: 12-03-1965  Transition of Care [facility](TOC) CM/SW Contact:    Clearance CootsNicole A Annah Jasko, LCSW Phone Number: 02/25/2019, 3:50 PM  Clinical Narrative:                 CSW reached out to [facility] provided referral for Home IV antibiotics infusion instruction. [facility].   Re: Physical Therapy-CSW reached out to all Home Health Providers in that serve patient area code, All the Health Health agencies have declined services:  [facility] -Out of Network Insurance [facility]- [employer] not provide Charity fundraiserN or Adult nursehysical Therapist.  [facility]-Not in Network with [employer]  [facility]Not in Network with [facility]  [place]-Out of Service area [facility]- Out of Service area   CSW notified the patient and nurse. Patient feels he can go without therapy, csw informed the patient he will need to discuss with the physician. Patient may need outpatient therapy arranged.    Expected Discharge Plan: Home w Home Health Services Barriers to Discharge: (No Home Health Agency for Physical Therapy.)   Patient Goals and CMS Choice   CMS Medicare.gov Compare Post Acute Care list provided to:: Patient Choice offered to / list presented to : Patient  Expected Discharge Plan and Services Expected Discharge Plan: Home w Home Health Services In-house Referral: Clinical Social Work Discharge Planning Services: CM Consult Post Acute Care Choice: Home Health Living arrangements for the past 2 months: Single Family Home Expected Discharge Date: (unknown)                         HH Arranged: IV Antibiotics, RN HH Agency: [facility] Date HH Agency Contacted: 02/25/19 Time HH Agency Contacted: 1003 [facility]  Prior Living  Arrangements/Services Living arrangements for the past 2 months: Single Family Home Lives with:: Spouse Patient language and need for interpreter reviewed:: No Do you feel safe going back to the place where you live?: Yes      Need for Family Participation in Patient Care: Yes (Comment) Care giver support system in place?: Yes (comment) Current home services: Home RN Criminal Activity/Legal Involvement Pertinent to Current Situation/Hospitalization: Yes - Comment as needed  Activities of Daily Living Home Assistive Devices/Equipment: Eyeglasses, CPAP, Walker (specify type), Cane (specify quad or straight), Raised toilet seat with rails(front wheeled walker, single point cane) ADL Screening (condition at time of admission) Patient's cognitive ability adequate to safely complete daily activities?: Yes Is the patient deaf or have difficulty hearing?: No Does the patient have difficulty seeing, even when wearing glasses/contacts?: No Does the patient have difficulty concentrating, remembering, or making decisions?: No Patient able to express need for assistance with ADLs?: Yes Does the patient have difficulty dressing or bathing?: No Independently performs ADLs?: Yes (appropriate for developmental age) Does the patient have difficulty walking or climbing stairs?: No Weakness of Legs: Left Weakness of Arms/Hands: None  Permission Sought/Granted Permission sought to share information with : Case Manager Permission granted to share information with : Yes, Verbal Permission Granted     Permission granted to share info w AGENCY: Home Health agencies        Emotional Assessment Appearance:: Appears stated age   Affect (typically observed): Accepting, Calm Orientation: : Oriented to Self,  Oriented to Place, Oriented to  Time, Oriented to Situation Alcohol / Substance Use: Not Applicable Psych Involvement: No (comment)  Admission diagnosis:  Post op infection [T81.40XA] Cellulitis  [L03.90] Patient Active Problem List   Diagnosis Date Noted  . Post-operative infection 02/22/2019  . Obese 01/27/2019  . 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

## 2019-02-25 NOTE — Progress Notes (Signed)
Physical Therapy Treatment Patient Details Name: Cole Gill MRN: [id number] DOB: October 07, 1965 Today's Date: 02/25/2019    History of Present Illness 53 yo male s/p L THA-direct anterior 01/26/19 admitted due to infection now s/p I&D with new head and liner . Hx of R THA-DA    PT Comments    POD # 2 am session Assisted OOB to amb to bathroom.  Assisted in bathroom then amb in hallway.    Follow Up Recommendations  Follow surgeon's recommendation for DC plan and follow-up therapies     Equipment Recommendations  None recommended by PT    Recommendations for Other Services       Precautions / Restrictions Precautions Precautions: Fall Restrictions Weight Bearing Restrictions: No Other Position/Activity Restrictions: WBAT    Mobility  Bed Mobility Overal bed mobility: Needs Assistance Bed Mobility: Supine to Sit;Sit to Supine     Supine to sit: Min guard Sit to supine: Min guard;Min assist   General bed mobility comments: close guard for safety. increased time.  Transfers Overall transfer level: Needs assistance Equipment used: Rolling walker (2 wheeled) Transfers: Sit to/from Stand Sit to Stand: Min guard;Supervision         General transfer comment: close guard for safety. Vcs safety, hand placement  Ambulation/Gait Ambulation/Gait assistance: Min guard;Supervision Gait Distance (Feet): 250 Feet Assistive device: Rolling walker (2 wheeled) Gait Pattern/deviations: Step-through pattern;Decreased stride length Gait velocity: WFL   General Gait Details: close guard for safety. VCs safety, sequence. Pt transitioned to step through pattern as distance increased.   Stairs             Wheelchair Mobility    Modified Rankin (Stroke Patients Only)       Balance                                            Cognition Arousal/Alertness: Awake/alert Behavior During Therapy: WFL for tasks assessed/performed Overall Cognitive Status:  Within Functional Limits for tasks assessed                                        Exercises      General Comments        Pertinent Vitals/Pain Pain Assessment: 0-10 Pain Score: 4  Pain Location: L hip Pain Descriptors / Indicators: Sore;Tender Pain Intervention(s): Premedicated before session;Monitored during session;Repositioned    Home Living                      Prior Function            PT Goals (current goals can now be found in the care plan section) Progress towards PT goals: Progressing toward goals    Frequency    7X/week      PT Plan Current plan remains appropriate    Co-evaluation              AM-PAC PT "6 Clicks" Mobility   Outcome Measure  Help needed turning from your back to your side while in a flat bed without using bedrails?: A Little Help needed moving from lying on your back to sitting on the side of a flat bed without using bedrails?: A Little Help needed moving to and from a bed to a chair (including a wheelchair)?: A Little Help  needed standing up from a chair using your arms (e.g., wheelchair or bedside chair)?: A Little Help needed to walk in hospital room?: A Little Help needed climbing 3-5 steps with a railing? : A Little 6 Click Score: 18    End of Session Equipment Utilized During Treatment: Gait belt Activity Tolerance: Patient tolerated treatment well Patient left: in bed;with call bell/phone within reach   PT Visit Diagnosis: Other abnormalities of gait and mobility (R26.89)     Time: 6168-3729 PT Time Calculation (min) (ACUTE ONLY): 24 min  Charges:  $Gait Training: 8-22 mins $Therapeutic Activity: 8-22 mins                     Rica Koyanagi  PTA Acute  Rehabilitation Services Pager      [phone] Office      [phone]

## 2019-02-25 NOTE — Progress Notes (Signed)
Physical Therapy Treatment Patient Details Name: Cole Gill MRN: [id number] DOB: July 05, 1966 Today's Date: 02/25/2019    History of Present Illness 53 yo male s/p L THA-direct anterior 01/26/19 admitted due to infection now s/p I&D with new head and liner . Hx of R THA-DA    PT Comments    POD # 2 pm session Assisted with amb in hallway a second time today a greater distance.  Tolerated well.  Min pain. Plans to D/C to home Friday or Saturday.    Follow Up Recommendations  Follow surgeon's recommendation for DC plan and follow-up therapies     Equipment Recommendations  None recommended by PT    Recommendations for Other Services       Precautions / Restrictions Precautions Precautions: Fall Restrictions Weight Bearing Restrictions: No Other Position/Activity Restrictions: WBAT    Mobility  Bed Mobility Overal bed mobility: Needs Assistance Bed Mobility: Supine to Sit;Sit to Supine     Supine to sit: Min guard Sit to supine: Min guard;Min assist   General bed mobility comments: close guard for safety. increased time.  Transfers Overall transfer level: Needs assistance Equipment used: Rolling walker (2 wheeled) Transfers: Sit to/from Stand Sit to Stand: Min guard;Supervision         General transfer comment: close guard for safety. Vcs safety, hand placement  Ambulation/Gait Ambulation/Gait assistance: Min guard;Supervision Gait Distance (Feet): 250 Feet Assistive device: Rolling walker (2 wheeled) Gait Pattern/deviations: Step-through pattern;Decreased stride length Gait velocity: WFL   General Gait Details: close guard for safety. VCs safety, sequence. Pt transitioned to step through pattern as distance increased.   Stairs             Wheelchair Mobility    Modified Rankin (Stroke Patients Only)       Balance                                            Cognition Arousal/Alertness: Awake/alert Behavior During  Therapy: WFL for tasks assessed/performed Overall Cognitive Status: Within Functional Limits for tasks assessed                                        Exercises      General Comments        Pertinent Vitals/Pain Pain Assessment: 0-10 Pain Score: 4  Pain Location: L hip Pain Descriptors / Indicators: Sore;Tender Pain Intervention(s): Premedicated before session;Monitored during session;Repositioned    Home Living                      Prior Function            PT Goals (current goals can now be found in the care plan section) Progress towards PT goals: Progressing toward goals    Frequency    7X/week      PT Plan Current plan remains appropriate    Co-evaluation              AM-PAC PT "6 Clicks" Mobility   Outcome Measure  Help needed turning from your back to your side while in a flat bed without using bedrails?: A Little Help needed moving from lying on your back to sitting on the side of a flat bed without using bedrails?: A Little Help needed moving to and  from a bed to a chair (including a wheelchair)?: A Little Help needed standing up from a chair using your arms (e.g., wheelchair or bedside chair)?: A Little Help needed to walk in hospital room?: A Little Help needed climbing 3-5 steps with a railing? : A Little 6 Click Score: 18    End of Session Equipment Utilized During Treatment: Gait belt Activity Tolerance: Patient tolerated treatment well Patient left: in bed;with call bell/phone within reach   PT Visit Diagnosis: Other abnormalities of gait and mobility (R26.89)     Time: 1610-9604 PT Time Calculation (min) (ACUTE ONLY): 24 min  Charges:  $Gait Training: 23-37 mins $Therapeutic Activity: 8-22 mins                     Rica Koyanagi  PTA Acute  Rehabilitation Services Pager      [phone] Office      [phone]

## 2019-02-25 NOTE — Progress Notes (Signed)
Patient ID: Cole Gill, male   DOB: Dec 06, 1965, 53 y.o.   MRN: [id number] Subjective: 2 Days Post-Op Procedure(s) (LRB): INCISION AND DRAINAGE DEEP LEFT HIP, HEAD BALL AND LINER EXCHANGE (Left)    [name] to moderate with some increase stiffness this am after therapy yesterday  Objective:   VITALS:   Vitals:   02/24/19 2210 02/25/19 0532  BP: 126/67 127/68  Pulse: 66 60  Resp: 16 16  Temp: 98.4 F (36.9 C) 98.3 F (36.8 C)  SpO2: 95% 95%    Neurovascular intact Incision: dressing C/D/I - left hip  LABS Recent Labs    02/22/19 1438 02/24/19 0322 02/25/19 0244  HGB 13.7 11.5* 9.9*  HCT 41.6 34.7* 30.6*  WBC 5.6 7.2 11.2*  PLT 358 353 342    Recent Labs    02/22/19 1438 02/24/19 0322 02/25/19 0244  NA 142 137 136  K 4.1 4.6 4.2  BUN 15 18 22*  CREATININE 1.06 0.88 1.06  GLUCOSE 99 140* 122*    Recent Labs    02/22/19 1438  INR 1.1     Assessment/Plan: 2 Days Post-Op Procedure(s) (LRB): INCISION AND DRAINAGE DEEP LEFT HIP, HEAD BALL AND LINER EXCHANGE (Left)   Up with therapy Continue ABX therapy due to infection identified in left hip  Awaiting cultures and appropriate antibiotics per ID consult Work on discharge to home Friday or Saturday pending arrangements PT for ambulation though he is already 5 weeks status post his left THR

## 2019-02-26 LAB — VANCOMYCIN, TROUGH: Vancomycin Tr: 9 ug/mL — ABNORMAL LOW (ref 15–20)

## 2019-02-26 MED ORDER — HEPARIN SOD (PORK) LOCK FLUSH 100 UNIT/ML IV SOLN
250.0000 [IU] | INTRAVENOUS | Status: DC | PRN
  Administered 2019-02-26: 21:00:00 250 [IU]

## 2019-02-26 MED ORDER — FERROUS SULFATE 325 (65 FE) MG PO TABS: 325.0000 mg | ORAL_TABLET | Freq: Three times a day (TID) | ORAL | 0 refills | Status: AC

## 2019-02-26 MED ORDER — VANCOMYCIN HCL 10 G IV SOLR
1250.0000 mg | Freq: Two times a day (BID) | INTRAVENOUS | Status: DC
  Administered 2019-02-26: 18:00:00 1250 mg via INTRAVENOUS
  Filled 2019-02-26 (×2): qty 1250

## 2019-02-26 MED ORDER — METHOCARBAMOL 500 MG PO TABS: 500.0000 mg | ORAL_TABLET | Freq: Four times a day (QID) | ORAL | 0 refills | Status: DC | PRN

## 2019-02-26 MED ORDER — HYDROMORPHONE HCL 2 MG PO TABS: 2.0000 mg | ORAL_TABLET | ORAL | 0 refills | Status: AC | PRN

## 2019-02-26 MED ORDER — VANCOMYCIN IV (FOR PTA / DISCHARGE USE ONLY): 1250.0000 mg | Freq: Two times a day (BID) | INTRAVENOUS | 0 refills | Status: DC

## 2019-02-26 MED ORDER — RIFAMPIN 300 MG PO CAPS: 300.0000 mg | ORAL_CAPSULE | Freq: Two times a day (BID) | ORAL | 1 refills | Status: DC

## 2019-02-26 MED ORDER — VANCOMYCIN HCL 10 G IV SOLR
1250.0000 mg | INTRAVENOUS | Status: AC
  Administered 2019-02-26: 07:00:00 1250 mg via INTRAVENOUS
  Filled 2019-02-26: qty 1250

## 2019-02-26 MED ORDER — POLYETHYLENE GLYCOL 3350 17 G PO PACK: 17.0000 g | PACK | Freq: Two times a day (BID) | ORAL | 0 refills | Status: DC

## 2019-02-26 MED ORDER — RIFAMPIN 300 MG PO CAPS: 300.0000 mg | ORAL_CAPSULE | Freq: Two times a day (BID) | ORAL | 0 refills | Status: DC

## 2019-02-26 MED ORDER — ASPIRIN 81 MG PO CHEW: 81.0000 mg | CHEWABLE_TABLET | Freq: Two times a day (BID) | ORAL | 0 refills | Status: AC

## 2019-02-26 MED ORDER — DOCUSATE SODIUM 100 MG PO CAPS: 100.0000 mg | ORAL_CAPSULE | Freq: Two times a day (BID) | ORAL | 0 refills | Status: DC

## 2019-02-26 NOTE — Plan of Care (Signed)
Care plan reviewed.

## 2019-02-26 NOTE — Progress Notes (Signed)
Physical Therapy Treatment Patient Details Name: Cole Gill MRN: [id number] DOB: 02-04-1966 Today's Date: 02/26/2019    History of Present Illness 53 yo male s/p L THA-direct anterior 01/26/19 admitted due to infection now s/p I&D with new head and liner . Hx of R THA-DA    PT Comments    POD # 3 Pt progressing well with mobility.  No c/o pain just "tightness".  Amb a great distance then Then returned to room to perform some TE's following HEP handout.  Instructed on proper tech, freq as well as use of ICE.   Addressed all mobility questions, discussed appropriate activity, educated on use of ICE.  Pt ready for D/C to home.   Follow Up Recommendations  Follow surgeon's recommendation for DC plan and follow-up therapies     Equipment Recommendations  None recommended by PT    Recommendations for Other Services       Precautions / Restrictions Precautions Precautions: Fall Restrictions Weight Bearing Restrictions: No Other Position/Activity Restrictions: WBAT    Mobility  Bed Mobility Overal bed mobility: Modified Independent             General bed mobility comments: increased time  Transfers Overall transfer level: Modified independent Equipment used: Rolling walker (2 wheeled) Transfers: Sit to/from [facility] Sit to Stand: Modified independent (Device/Increase time) Stand pivot transfers: Modified independent (Device/Increase time)       General transfer comment: good safety cognition  Ambulation/Gait Ambulation/Gait assistance: Supervision Gait Distance (Feet): 500 Feet Assistive device: Rolling walker (2 wheeled) Gait Pattern/deviations: Step-through pattern;Decreased stride length Gait velocity: WFL   General Gait Details: tolerated an increased distance   Chief Strategy Officer    Modified Rankin (Stroke Patients Only)       Balance                                             Cognition Arousal/Alertness: Awake/alert Behavior During Therapy: WFL for tasks assessed/performed Overall Cognitive Status: Within Functional Limits for tasks assessed                                        Exercises  10 reps all standing TE    General Comments        Pertinent Vitals/Pain Pain Assessment: No/denies pain Pain Score: 2  Pain Location: L hip Pain Descriptors / Indicators: Sore;Tender Pain Intervention(s): Monitored during session;Repositioned;Ice applied    Home Living                      Prior Function            PT Goals (current goals can now be found in the care plan section) Progress towards PT goals: Progressing toward goals    Frequency    7X/week      PT Plan Current plan remains appropriate    Co-evaluation              AM-PAC PT "6 Clicks" Mobility   Outcome Measure  Help needed turning from your back to your side while in a flat bed without using bedrails?: None Help needed moving from lying on your back to sitting on the side of a flat bed  without using bedrails?: None Help needed moving to and from a bed to a chair (including a wheelchair)?: None Help needed standing up from a chair using your arms (e.g., wheelchair or bedside chair)?: None Help needed to walk in hospital room?: None Help needed climbing 3-5 steps with a railing? : A Little 6 Click Score: 23    End of Session Equipment Utilized During Treatment: Gait belt Activity Tolerance: Patient tolerated treatment well Patient left: in bed;with call bell/phone within reach Nurse Communication: Mobility status PT Visit Diagnosis: Other abnormalities of gait and mobility (R26.89)     Time: [id number] PT Time Calculation (min) (ACUTE ONLY): 24 min  Charges:  $Gait Training: 23-37 mins $Therapeutic Exercise: 8-22 mins                     Lawson Fiscal/Niyanna Asch Cleone SlimKropski  PTA Acute  Rehabilitation Services Pager      [phone] Office       [phone]

## 2019-02-26 NOTE — Progress Notes (Signed)
PHARMACY CONSULT NOTE FOR:  OUTPATIENT  PARENTERAL ANTIBIOTIC THERAPY (OPAT)  Indication: MRSA prosthetic joint infection of hip Regimen: vancomycin 1250mg  IV q12h (also to receive rifampin 300mg  po BID) End date: 04/07/2019  IV antibiotic discharge orders are pended. To discharging provider:  please sign these orders via discharge navigator,  Select New Orders & click on the button choice - Manage This Unsigned Work.     Thank you for allowing pharmacy to be a part of this patient's care.  Doreene Eland, PharmD, BCPS.   Work Cell: [phone] 02/26/2019 10:56 AM

## 2019-02-26 NOTE — Progress Notes (Signed)
     Subjective: 3 Days Post-Op Procedure(s) (LRB): INCISION AND DRAINAGE DEEP LEFT HIP, HEAD BALL AND LINER EXCHANGE (Left)   [name], pain controlled. No events throughout the night.  Dr. Alvan Dame discussed the PICC line and antibiotic use.  Discussed expectation moving forward.  [name], but we will wait on final recs from ID.   If final rec placed and home health arranged he can be discharged home later today.   Objective:   VITALS:   Vitals:   02/25/19 2127 02/26/19 0536  BP: 120/74 126/71  Pulse: (!) 59 63  Resp: 16 16  Temp: 98.2 F (36.8 C) 98.2 F (36.8 C)  SpO2: 95% 94%    Dorsiflexion/Plantar flexion intact Incision: dressing C/D/I No cellulitis present Compartment soft  LABS Recent Labs    02/24/19 0322 02/25/19 0244  HGB 11.5* 9.9*  HCT 34.7* 30.6*  WBC 7.2 11.2*  PLT 353 342    Recent Labs    02/24/19 0322 02/25/19 0244  NA 137 136  K 4.6 4.2  BUN 18 22*  CREATININE 0.88 1.06  GLUCOSE 140* 122*     Assessment/Plan: 3 Days Post-Op Procedure(s) (LRB): INCISION AND DRAINAGE DEEP LEFT HIP, HEAD BALL AND LINER EXCHANGE (Left) Awaiting final rec from ID Hopefully placed and will sign of later Up with therapy Discharge home with home health  Follow up in 2 weeks at [facility]. Follow up with OLIN,Jarren Para D in 2 weeks.  Contact information:  [facility]  [address], [address] [place]   PAC  02/26/2019, 7:45 AM

## 2019-02-26 NOTE — Progress Notes (Signed)
Pharmacy Antibiotic Note  Cole Gill is a 53 y.o. male admitted on 02/22/2019 with infection of left total hip arthroplasty .  Pharmacy has been consulted for Vancomycin dosing.  Plan: Change vancomycin to 1250mg  iv q12hr starting with 7/17 AM dose  Height: 5\' 11"  (180.3 cm) Weight: 235 lb 14.3 oz (107 kg) IBW/kg (Calculated) : 75.3  Temp (24hrs), Avg:98.2 F (36.8 C), Min:98.2 F (36.8 C), Max:98.2 F (36.8 C)  Recent Labs  Lab 02/22/19 1438 02/24/19 0322 02/25/19 0244 02/25/19 2110 02/26/19 0531  WBC 5.6 7.2 11.2*  --   --   CREATININE 1.06 0.88 1.06  --   --   VANCOTROUGH  --   --   --   --  9*  VANCOPEAK  --   --   --  29*  --     Estimated Creatinine Clearance: 98 mL/min (by C-G formula based on SCr of 1.06 mg/dL).    No Known Allergies  Antimicrobials this admission: 7/13 Cefazolin >>  7/14 Vancomycin >> 7/15 Rifampin >>  Levels:  7/16 2110 Vanco peak =  29 (on 1gm IV q12h with 4th dose, dose at 1915 7/16) 7/17 0531 vanco trough = 9     Microbiology results: 7/13 COVID: negative 7/14 (at 1512) left hip synovial fluid: s. aureus 7/14 (at 2316) left hip synovial fluid: few GPC on gram stain, s. aureus   Thank you for allowing pharmacy to be a part of this patient's care.  Nani Skillern Crowford 02/26/2019 6:43 AM

## 2019-02-26 NOTE — Progress Notes (Signed)
2145 delivery obtained from outpatient pharmacy in a sealed box.  Patient sighed for it at the ER lobby and was discharged home with his wife.  Patient ambulated from W/C and transferred to the front seat of their grey pickup truck independently.  Acknowledged understanding discharge instructions and what symptoms to report to his providers.  End note.

## 2019-02-28 LAB — AEROBIC/ANAEROBIC CULTURE W GRAM STAIN (SURGICAL/DEEP WOUND)

## 2019-03-02 NOTE — Discharge Summary (Signed)
Physician Discharge Summary  Patient ID: Cole Gill MRN: [id number] DOB/AGE: [birth year] 53 y.o.  Admit date: 02/22/2019  Procedure date: 02/24/2019 Discharge date: 02/26/2019   Procedures:  Procedure(s) (LRB): INCISION AND DRAINAGE DEEP LEFT HIP, HEAD BALL AND LINER EXCHANGE (Left)  Attending Physician:  Dr. Paralee Cancel   Admission Diagnoses:    Infected left hip  Discharge Diagnoses:  Active Problems:   Post-operative infection  Past Medical History:  Diagnosis Date  . Anxiety   . Arthritis    hips knees fingers  . Depression   . Hypertension    Dr. Burnett Harry  . Sleep apnea    C-pap    HPI:    Pt is a 53 y.o. male complaining of left hip pain for 4 days.  [name], AA per Dr. Alvan Dame on 01/26/2019.  [name] of the left hip around previous incision on Friday.  Saturday the swelling reduced, but returned on _0 .6   POD #2  BP: 126/71 ; Pulse: 63 ; Temp: 98.2 F (36.8 C) ; Resp: 16 [name], pain controlled. No events throughout the night.  Dr. Alvan Dame discussed the PICC line and antibiotic use.  Discussed expectation moving forward.  [name], but we will wait on final recs from ID.   If final rec placed and home health arranged he can be discharged home. Dorsiflexion/plantar flexion intact, incision: dressing C/D/I, no cellulitis present and compartment soft.   LABS   No new labs  Discharge Exam: General appearance: alert, cooperative and no distress Extremities: Homans sign is negative, no sign of DVT, no edema, redness or tenderness in the calves or thighs and no ulcers, gangrene or trophic changes  Disposition:  Home with follow up in 2 weeks   Follow-up Information    Paralee Cancel, MD. Schedule an appointment as soon as possible for a visit in 2 weeks.   Specialty: Orthopedic Surgery Contact information: [address] [place] [address] [phone]  Discharge Instructions    Call MD / Call 911   Complete by: As directed    If you experience chest pain or shortness of breath, CALL 911 and be transported to the hospital emergency room.  If you develope a fever above 101 F, pus (white drainage) or increased drainage or redness at the wound, or calf pain, call your surgeon's office.   Change dressing   Complete by: As directed    Maintain surgical dressing until follow up in the clinic. If the edges start to pull up, may reinforce with tape. If the dressing is no longer working, may remove and cover  with gauze and tape, but must keep the area dry and clean.  Call with any questions or concerns.   Constipation Prevention   Complete by: As directed    Drink plenty of fluids.  Prune juice may be helpful.  You may use a stool softener, such as Colace (over the counter) 100 mg twice a day.  Use MiraLax (over the counter) for constipation as needed.   Diet - low sodium heart healthy   Complete by: As directed    Discharge instructions   Complete by: As directed    Maintain surgical dressing until follow up in the clinic. If the edges start to pull up, may reinforce with tape. If the dressing is no longer working, may remove and cover with gauze and tape, but must keep the area dry and clean.  Follow up in 2 weeks at [facility]. Call with any questions or concerns.   Home infusion instructions [facility] May follow [facility] Dosing Protocol; May administer Cathflo as needed to maintain patency of vascular access device.; Flushing of vascular access device: per [facility] Protocol: 0.9% NaCl pre/post medica...   Complete by: As directed    Instructions: May follow [facility] Dosing Protocol   Instructions: May administer Cathflo as needed to maintain patency of vascular access device.   Instructions: Flushing of vascular access device: per [facility] Protocol: 0.9% NaCl pre/post medication administration and prn patency; Heparin 100 u/ml, 62m for implanted ports and Heparin 10u/ml, 547mfor all other central venous catheters.   Instructions: May follow [facility] Anaphylaxis Protocol for First Dose Administration in the home: 0.9% NaCl at 25-50 ml/hr to maintain IV access for protocol meds. Epinephrine 0.3 ml IV/IM PRN and Benadryl 25-50 IV/IM PRN s/s of anaphylaxis.   Instructions: [facility] Coordinator (RN) to assist per [name] in the home PRN.   Increase activity slowly as tolerated   Complete by: As directed    Weight bearing as tolerated with assist device  (walker, cane, etc) as directed, use it as long as suggested by your surgeon or therapist, typically at least 4-6 weeks.   TED hose   Complete by: As directed    Use stockings (TED hose) for 2 weeks on both leg(s).  You may remove them at night for sleeping.      Allergies as of 02/26/2019   No Known Allergies     Medication List    STOP taking these medications   senna 8.6 MG Tabs tablet Commonly known as: SENOKOT     TAKE these medications   acetaminophen 500 MG tablet Commonly known as: TYLENOL Take 1,000 mg by mouth every 6 (six) hours as needed for moderate pain.   amLODIPine-Valsartan-HCTZ 10-320-25 MG Tabs Take 1 tablet by mouth daily.   aspirin 81 MG chewable tablet Commonly known as: Aspirin  Childrens Chew 1 tablet (81 mg total) by mouth 2 (two) times daily. Take for 4 weeks, then resume regular dose.   celecoxib 200 MG capsule Commonly known as: CELEBREX Take 200 mg by mouth 2 (two) times daily.   docusate sodium 100 MG capsule Commonly known as: Colace Take 1 capsule (100 mg total) by mouth 2 (two) times daily.   ferrous sulfate 325 (65 FE) MG tablet Commonly known as: FerrouSul Take 1 tablet (325 mg total) by mouth 3 (three) times daily with meals for 14 days.   fluticasone 50 MCG/ACT nasal spray Commonly known as: FLONASE Place 2 sprays into both nostrils 2 (two) times daily as needed for allergies.   Fruity Chews/Iron Chew Chew 1 tablet by mouth daily.   HYDROmorphone 2 MG tablet Commonly known as: Dilaudid Take 1-2 tablets (2-4 mg total) by mouth every 4 (four) hours as needed for severe pain.   Magnesium 400 MG Tabs Take 800 mg by mouth at bedtime.   methocarbamol 500 MG tablet Commonly known as: Robaxin Take 1 tablet (500 mg total) by mouth every 6 (six) hours as needed for muscle spasms.   polyethylene glycol 17 g packet Commonly known as: MIRALAX / GLYCOLAX Take 17 g by mouth 2 (two) times daily.   rifampin 300 MG capsule Commonly  known as: Rifadin Take 1 capsule (300 mg total) by mouth 2 (two) times daily.   sodium chloride 0.65 % Soln nasal spray Commonly known as: OCEAN Place 1 spray into both nostrils daily as needed for congestion.   tamsulosin 0.4 MG Caps capsule Commonly known as: FLOMAX Take 0.8 mg by mouth daily.   testosterone cypionate 200 MG/ML injection Commonly known as: DEPOTESTOSTERONE CYPIONATE Inject 100 mg into the muscle once a week.   vancomycin  IVPB Inject 1,250 mg into the vein every 12 (twelve) hours. Indication: MRSA prosthetic joint infection of hip Last Day of Therapy:  04/07/2019 Labs - _0 /17/20 1241           Discharge Care Instructions  (From admission, onward)  Start     Ordered   02/26/19 0000  Change dressing    Comments: Maintain surgical dressing until follow up in the clinic. If the edges start to pull up, may reinforce with tape. If the dressing is no longer working, may remove and cover with gauze and tape, but must keep the area dry and clean.  Call with any questions or concerns.   02/26/19 7209           Signed: [place]. Crescencio Jozwiak   PA-C  03/02/2019, 8:34 AM

## 2019-03-04 ENCOUNTER — Telehealth: Payer: Self-pay

## 2019-03-04 NOTE — Telephone Encounter (Signed)
Vida, Pharmacist with [facility] called office today stating patient Vancomycin trough is low at 8.9. Patient is currently on Vancomycin 1250 Q12 hours. [facility] would like to know if Dr. Jules Schick would like to do a dose change. Will forward message to MD. [facility]: P: [phone]. [place]

## 2019-03-04 NOTE — Telephone Encounter (Signed)
Spoke with Vida at [facility] to inform of dose change per MD. Pharmacist was able to take order over phone. Order was repeated and confirmed before ending call. [place]

## 2019-03-04 NOTE — Telephone Encounter (Signed)
Please have them increase his dose to 1500 mg IV q 12 hrs. Repeat labs as otherwise ordered this coming Monday.

## 2019-03-10 ENCOUNTER — Telehealth: Payer: Self-pay | Admitting: *Deleted

## 2019-03-10 NOTE — Telephone Encounter (Signed)
Patient called to report that he is feeling very bad. He has been on IV Vanc for 2 weeks and on Sunday, 03/07/19, he was started an increased dose of Vanc. Since then he states he has had a low grade fever (99.6), chills, joint pains no appetite (he feels full all the time and unable to eat). He is able to drink plenty of fluids and denies Rash, shortness of breath, chest pains or increased drainage (sites looks good). His home health nurse comes tomorrow 03/11/19 but he felt he needs to let the doctor know. Advised him it is the end of the day and if the provider does not call him tonight someone will call him first thing tomorrow. He was fine with this response.

## 2019-03-11 ENCOUNTER — Telehealth: Payer: Self-pay

## 2019-03-11 NOTE — Telephone Encounter (Signed)
Patient is calling with complaint of fatigue , headache, joint pain that started 2 days after IV antibiotic increase.  Fatigue is so bad he can not ambulate from couch to bed. Temperature ranged from 101- 101.5 and was hard to manage with alternating tylenol and ibuprofen.  Last does of  IV medications was 9 pm last night. Fever spiked about 30 mins - 1 hour after dose.  Last labs drawn on 03-07-2019 .  Per patient  Vancomycin  increased from 1200mg  to 1500 mg  on Sunday , March 07, 2019.   Patient reports they have been home only since hospital discharge without any visitors.  Wife held 9 am dose for today since she thinks symptoms may be related to IV medications.    Please advise.   Laverle Patter, RN   Pt phone:  [phone]

## 2019-03-11 NOTE — Telephone Encounter (Signed)
Unfortunately,  his vancomycin is not the cause of his symptoms.  I would encourage him to see his pcp for an in person visit, so they can assess him. Do you have his labs from this week yet?

## 2019-03-11 NOTE — Telephone Encounter (Signed)
Patient's wife is calling. She is not comfortable with patient receiving IV medication via PICC due to his previous symptoms  she assumes is associated with IV medications.  She would like to go to outpatient  hospital site  for infusion or home health nurse to start IV medications through peripheral access .  The home health nurse would need a written order.  The wife states the nurse will start the infusion and she will discontinue IV access when medication  is completed.    Please advise.    Laverle Patter, RN

## 2019-03-11 NOTE — Telephone Encounter (Signed)
Labs received for 03-01-2019   Vancomycin  trough 8.9   WBC 8.93 Creatinine 0.9   BUN :15   Cole Ostrander Mahlon Gammon, RN

## 2019-03-11 NOTE — Telephone Encounter (Signed)
I just replied to [place] over this same patient. It's unlikely his vancomycin as that's not a huge increase in his dose. Please have home health collect bloods cxs x 2, 1 via his picc and 1 peripheral as I'm more concerned for a picc line infection. Remind him he is NOT to shower with his picc in.

## 2019-03-11 NOTE — Telephone Encounter (Signed)
Spoke with patient and obtained phone number for home health nurse, [place]  [phone] phone. Left message to call for verbal orders that will need to be completed today for patient visit.    Patient informed of plan for blood cultures and advised to not take any showers.     Laverle Patter, RN

## 2019-03-11 NOTE — Telephone Encounter (Signed)
Orders sent to [facility] for peripheral infusion of IV antibiotic for use for the next three days, until blood cultures result per Dr Prince Rome.   Laverle Patter, RN

## 2019-03-11 NOTE — Telephone Encounter (Signed)
Return call from [facility], RN - order will need to be faxed to [facility] and pharmacy.  She does not know which pharmacy is managing his medications.    Fax number for [facility] is [phone] and orders were faxed.  Message also left to fax last set of labs drawn on voicemail for [facility]-lab coordinator.   Phone for [facility]: [phone]  Patient was informed to resume IV antibiotics per Dr Prince Rome, who stated he never gave an order for discontinuation.   Patient was not in agreement with restarting medications since he thinks this is the source of his symptoms.  He was advised to call the office or go to the ER for evaluation if the symptoms return.    Additional order added and faxed:  Nurse to check BP and if systolic reading is less than 90 patient will need to go to ER for evaluation.   Pharmacy managing IV antibiotics is [facility] phone:  [phone]

## 2019-03-12 ENCOUNTER — Emergency Department (HOSPITAL_COMMUNITY): Payer: BC Managed Care – PPO

## 2019-03-12 ENCOUNTER — Inpatient Hospital Stay (HOSPITAL_COMMUNITY)
Admission: EM | Admit: 2019-03-12 | Discharge: 2019-03-16 | DRG: 607 | Disposition: A | Discharge status: Home / Self Care | Payer: BC Managed Care – PPO | Attending: Student | Admitting: Student

## 2019-03-12 ENCOUNTER — Encounter (HOSPITAL_COMMUNITY): Payer: Self-pay

## 2019-03-12 ENCOUNTER — Other Ambulatory Visit: Payer: Self-pay

## 2019-03-12 DIAGNOSIS — T8459XD Infection and inflammatory reaction due to other internal joint prosthesis, subsequent encounter: Secondary | ICD-10-CM | POA: Diagnosis not present

## 2019-03-12 DIAGNOSIS — Z87891 Personal history of nicotine dependence: Secondary | ICD-10-CM | POA: Diagnosis not present

## 2019-03-12 DIAGNOSIS — T368X5A Adverse effect of other systemic antibiotics, initial encounter: Secondary | ICD-10-CM | POA: Diagnosis present

## 2019-03-12 DIAGNOSIS — A4902 Methicillin resistant Staphylococcus aureus infection, unspecified site: Secondary | ICD-10-CM

## 2019-03-12 DIAGNOSIS — F329 Major depressive disorder, single episode, unspecified: Secondary | ICD-10-CM | POA: Diagnosis present

## 2019-03-12 DIAGNOSIS — L270 Generalized skin eruption due to drugs and medicaments taken internally: Principal | ICD-10-CM | POA: Diagnosis present

## 2019-03-12 DIAGNOSIS — Z96651 Presence of right artificial knee joint: Secondary | ICD-10-CM | POA: Diagnosis not present

## 2019-03-12 DIAGNOSIS — Z881 Allergy status to other antibiotic agents status: Secondary | ICD-10-CM | POA: Diagnosis not present

## 2019-03-12 DIAGNOSIS — M19041 Primary osteoarthritis, right hand: Secondary | ICD-10-CM | POA: Diagnosis present

## 2019-03-12 DIAGNOSIS — I10 Essential (primary) hypertension: Secondary | ICD-10-CM | POA: Diagnosis present

## 2019-03-12 DIAGNOSIS — Z800 Family history of malignant neoplasm of digestive organs: Secondary | ICD-10-CM | POA: Diagnosis not present

## 2019-03-12 DIAGNOSIS — Z7982 Long term (current) use of aspirin: Secondary | ICD-10-CM | POA: Diagnosis not present

## 2019-03-12 DIAGNOSIS — T8452XA Infection and inflammatory reaction due to internal left hip prosthesis, initial encounter: Secondary | ICD-10-CM | POA: Diagnosis present

## 2019-03-12 DIAGNOSIS — Z452 Encounter for adjustment and management of vascular access device: Secondary | ICD-10-CM

## 2019-03-12 DIAGNOSIS — R7989 Other specified abnormal findings of blood chemistry: Secondary | ICD-10-CM

## 2019-03-12 DIAGNOSIS — Z20828 Contact with and (suspected) exposure to other viral communicable diseases: Secondary | ICD-10-CM | POA: Diagnosis present

## 2019-03-12 DIAGNOSIS — R748 Abnormal levels of other serum enzymes: Secondary | ICD-10-CM | POA: Diagnosis not present

## 2019-03-12 DIAGNOSIS — R740 Nonspecific elevation of levels of transaminase and lactic acid dehydrogenase [LDH]: Secondary | ICD-10-CM | POA: Diagnosis not present

## 2019-03-12 DIAGNOSIS — G4733 Obstructive sleep apnea (adult) (pediatric): Secondary | ICD-10-CM | POA: Diagnosis present

## 2019-03-12 DIAGNOSIS — F419 Anxiety disorder, unspecified: Secondary | ICD-10-CM | POA: Diagnosis present

## 2019-03-12 DIAGNOSIS — B9562 Methicillin resistant Staphylococcus aureus infection as the cause of diseases classified elsewhere: Secondary | ICD-10-CM | POA: Diagnosis present

## 2019-03-12 DIAGNOSIS — R502 Drug induced fever: Secondary | ICD-10-CM | POA: Diagnosis present

## 2019-03-12 DIAGNOSIS — Z96643 Presence of artificial hip joint, bilateral: Secondary | ICD-10-CM | POA: Diagnosis present

## 2019-03-12 DIAGNOSIS — D649 Anemia, unspecified: Secondary | ICD-10-CM | POA: Diagnosis present

## 2019-03-12 DIAGNOSIS — N179 Acute kidney failure, unspecified: Secondary | ICD-10-CM | POA: Diagnosis present

## 2019-03-12 DIAGNOSIS — M00052 Staphylococcal arthritis, left hip: Secondary | ICD-10-CM | POA: Diagnosis not present

## 2019-03-12 DIAGNOSIS — M170 Bilateral primary osteoarthritis of knee: Secondary | ICD-10-CM | POA: Diagnosis present

## 2019-03-12 DIAGNOSIS — Z95828 Presence of other vascular implants and grafts: Secondary | ICD-10-CM | POA: Diagnosis not present

## 2019-03-12 DIAGNOSIS — T50905A Adverse effect of unspecified drugs, medicaments and biological substances, initial encounter: Secondary | ICD-10-CM | POA: Diagnosis present

## 2019-03-12 DIAGNOSIS — Y831 Surgical operation with implant of artificial internal device as the cause of abnormal reaction of the patient, or of later complication, without mention of misadventure at the time of the procedure: Secondary | ICD-10-CM | POA: Diagnosis present

## 2019-03-12 DIAGNOSIS — Z7951 Long term (current) use of inhaled steroids: Secondary | ICD-10-CM

## 2019-03-12 DIAGNOSIS — M19042 Primary osteoarthritis, left hand: Secondary | ICD-10-CM | POA: Diagnosis present

## 2019-03-12 DIAGNOSIS — R945 Abnormal results of liver function studies: Secondary | ICD-10-CM

## 2019-03-12 DIAGNOSIS — E871 Hypo-osmolality and hyponatremia: Secondary | ICD-10-CM | POA: Diagnosis present

## 2019-03-12 DIAGNOSIS — R21 Rash and other nonspecific skin eruption: Secondary | ICD-10-CM

## 2019-03-12 DIAGNOSIS — Z96642 Presence of left artificial hip joint: Secondary | ICD-10-CM | POA: Diagnosis not present

## 2019-03-12 DIAGNOSIS — T8459XA Infection and inflammatory reaction due to other internal joint prosthesis, initial encounter: Secondary | ICD-10-CM

## 2019-03-12 DIAGNOSIS — Z96649 Presence of unspecified artificial hip joint: Secondary | ICD-10-CM

## 2019-03-12 DIAGNOSIS — M160 Bilateral primary osteoarthritis of hip: Secondary | ICD-10-CM | POA: Diagnosis present

## 2019-03-12 DIAGNOSIS — T8452XD Infection and inflammatory reaction due to internal left hip prosthesis, subsequent encounter: Secondary | ICD-10-CM | POA: Diagnosis not present

## 2019-03-12 DIAGNOSIS — R509 Fever, unspecified: Secondary | ICD-10-CM

## 2019-03-12 DIAGNOSIS — T50905D Adverse effect of unspecified drugs, medicaments and biological substances, subsequent encounter: Secondary | ICD-10-CM | POA: Diagnosis not present

## 2019-03-12 DIAGNOSIS — D72829 Elevated white blood cell count, unspecified: Secondary | ICD-10-CM | POA: Diagnosis not present

## 2019-03-12 DIAGNOSIS — Z8249 Family history of ischemic heart disease and other diseases of the circulatory system: Secondary | ICD-10-CM | POA: Diagnosis not present

## 2019-03-12 DIAGNOSIS — K7200 Acute and subacute hepatic failure without coma: Secondary | ICD-10-CM | POA: Diagnosis not present

## 2019-03-12 LAB — COMPREHENSIVE METABOLIC PANEL
ALT: 195 U/L — ABNORMAL HIGH (ref 0–44)
AST: 272 U/L — ABNORMAL HIGH (ref 15–41)
Albumin: 3.8 g/dL (ref 3.5–5.0)
Alkaline Phosphatase: 243 U/L — ABNORMAL HIGH (ref 38–126)
Anion gap: 11 (ref 5–15)
BUN: 25 mg/dL — ABNORMAL HIGH (ref 6–20)
CO2: 26 mmol/L (ref 22–32)
Calcium: 8.7 mg/dL — ABNORMAL LOW (ref 8.9–10.3)
Chloride: 95 mmol/L — ABNORMAL LOW (ref 98–111)
Creatinine, Ser: 1.82 mg/dL — ABNORMAL HIGH (ref 0.61–1.24)
GFR calc Af Amer: 47 mL/min — ABNORMAL LOW (ref >60)
GFR calc non Af Amer: 41 mL/min — ABNORMAL LOW (ref >60)
Glucose, Bld: 122 mg/dL — ABNORMAL HIGH (ref 70–99)
Potassium: 3.8 mmol/L (ref 3.5–5.1)
Sodium: 132 mmol/L — ABNORMAL LOW (ref 135–145)
Total Bilirubin: 2.7 mg/dL — ABNORMAL HIGH (ref 0.3–1.2)
Total Protein: 7.6 g/dL (ref 6.5–8.1)

## 2019-03-12 LAB — CBC WITH DIFFERENTIAL/PLATELET
Abs Immature Granulocytes: 0.03 10*3/uL (ref 0.00–0.07)
Basophils Absolute: 0 10*3/uL (ref 0.0–0.1)
Basophils Relative: 0 %
Eosinophils Absolute: 0.3 10*3/uL (ref 0.0–0.5)
Eosinophils Relative: 5 %
HCT: 37.7 % — ABNORMAL LOW (ref 39.0–52.0)
Hemoglobin: 12.5 g/dL — ABNORMAL LOW (ref 13.0–17.0)
Immature Granulocytes: 1 %
Lymphocytes Relative: 5 %
Lymphs Abs: 0.3 10*3/uL — ABNORMAL LOW (ref 0.7–4.0)
MCH: 31.3 pg (ref 26.0–34.0)
MCHC: 33.2 g/dL (ref 30.0–36.0)
MCV: 94.5 fL (ref 80.0–100.0)
Monocytes Absolute: 0.1 10*3/uL (ref 0.1–1.0)
Monocytes Relative: 2 %
Neutro Abs: 5.3 10*3/uL (ref 1.7–7.7)
Neutrophils Relative %: 87 %
Platelets: 239 10*3/uL (ref 150–400)
RBC: 3.99 MIL/uL — ABNORMAL LOW (ref 4.22–5.81)
RDW: 14.1 % (ref 11.5–15.5)
WBC: 6.1 10*3/uL (ref 4.0–10.5)
nRBC: 0 % (ref 0.0–0.2)

## 2019-03-12 LAB — URINALYSIS, ROUTINE W REFLEX MICROSCOPIC
Bilirubin Urine: NEGATIVE
Glucose, UA: NEGATIVE mg/dL
Hgb urine dipstick: NEGATIVE
Ketones, ur: NEGATIVE mg/dL
Leukocytes,Ua: NEGATIVE
Nitrite: NEGATIVE
Protein, ur: 30 mg/dL — AB
Specific Gravity, Urine: 1.017 (ref 1.005–1.030)
pH: 5 (ref 5.0–8.0)

## 2019-03-12 LAB — CK: Total CK: 36 U/L — ABNORMAL LOW (ref 49–397)

## 2019-03-12 LAB — MRSA PCR SCREENING: MRSA by PCR: NEGATIVE

## 2019-03-12 LAB — LACTIC ACID, PLASMA
Lactic Acid, Venous: 1.6 mmol/L (ref 0.5–1.9)
Lactic Acid, Venous: 1.8 mmol/L (ref 0.5–1.9)
Lactic Acid, Venous: 1 mmol/L (ref 0.5–1.9)

## 2019-03-12 LAB — PROCALCITONIN: Procalcitonin: 1.69 ng/mL

## 2019-03-12 LAB — C-REACTIVE PROTEIN: CRP: 17.3 mg/dL — ABNORMAL HIGH (ref <1.0)

## 2019-03-12 LAB — ACETAMINOPHEN LEVEL: Acetaminophen (Tylenol), Serum: 13 ug/mL (ref 10–30)

## 2019-03-12 LAB — SARS CORONAVIRUS 2 BY RT PCR (HOSPITAL ORDER, PERFORMED IN ~~LOC~~ HOSPITAL LAB): SARS Coronavirus 2: NEGATIVE

## 2019-03-12 MED ORDER — SODIUM CHLORIDE 0.9 % IV BOLUS (SEPSIS)
1000.0000 mL | Freq: Once | INTRAVENOUS | Status: AC
  Administered 2019-03-12: 1000 mL via INTRAVENOUS

## 2019-03-12 MED ORDER — SODIUM CHLORIDE 0.9 % IV SOLN
6.0000 mg/kg | INTRAVENOUS | Status: DC
  Administered 2019-03-12 – 2019-03-14 (×3): 619 mg via INTRAVENOUS
  Filled 2019-03-12 (×4): qty 12.38

## 2019-03-12 MED ORDER — ACETAMINOPHEN 500 MG PO TABS
500.0000 mg | ORAL_TABLET | Freq: Three times a day (TID) | ORAL | Status: DC | PRN
  Administered 2019-03-12 – 2019-03-14 (×6): 500 mg via ORAL
  Filled 2019-03-12 (×6): qty 1

## 2019-03-12 MED ORDER — PIPERACILLIN-TAZOBACTAM 3.375 G IVPB 30 MIN
3.3750 g | Freq: Once | INTRAVENOUS | Status: AC
  Administered 2019-03-12: 3.375 g via INTRAVENOUS
  Filled 2019-03-12: qty 50

## 2019-03-12 MED ORDER — MAGNESIUM 400 MG PO TABS: 800.0000 mg | ORAL_TABLET | Freq: Every day | ORAL | Status: DC

## 2019-03-12 MED ORDER — METHOCARBAMOL 500 MG PO TABS: 500.0000 mg | ORAL_TABLET | Freq: Four times a day (QID) | ORAL | Status: DC | PRN

## 2019-03-12 MED ORDER — DIPHENHYDRAMINE HCL 25 MG PO CAPS
50.0000 mg | ORAL_CAPSULE | Freq: Once | ORAL | Status: AC
  Administered 2019-03-12: 50 mg via ORAL
  Filled 2019-03-12: qty 2

## 2019-03-12 MED ORDER — ENOXAPARIN SODIUM 40 MG/0.4ML ~~LOC~~ SOLN
40.0000 mg | SUBCUTANEOUS | Status: DC
  Administered 2019-03-12 – 2019-03-15 (×4): 40 mg via SUBCUTANEOUS
  Filled 2019-03-12 (×4): qty 0.4

## 2019-03-12 MED ORDER — DIPHENHYDRAMINE HCL 50 MG/ML IJ SOLN: 25.0000 mg | Freq: Four times a day (QID) | INTRAMUSCULAR | Status: DC | PRN

## 2019-03-12 MED ORDER — LINEZOLID 600 MG/300ML IV SOLN
600.0000 mg | Freq: Once | INTRAVENOUS | Status: AC
  Administered 2019-03-12: 600 mg via INTRAVENOUS
  Filled 2019-03-12: qty 300

## 2019-03-12 MED ORDER — SODIUM CHLORIDE 0.9 % IV BOLUS: 500.0000 mL | Freq: Once | INTRAVENOUS | Status: DC

## 2019-03-12 MED ORDER — ONDANSETRON HCL 4 MG/2ML IJ SOLN: 4.0000 mg | Freq: Four times a day (QID) | INTRAMUSCULAR | Status: DC | PRN

## 2019-03-12 MED ORDER — VILAZODONE HCL 20 MG PO TABS
20.0000 mg | ORAL_TABLET | Freq: Every day | ORAL | Status: DC
  Administered 2019-03-12 – 2019-03-16 (×5): 20 mg via ORAL
  Filled 2019-03-12 (×7): qty 1

## 2019-03-12 MED ORDER — FAMOTIDINE IN NACL 20-0.9 MG/50ML-% IV SOLN
20.0000 mg | Freq: Two times a day (BID) | INTRAVENOUS | Status: DC
  Administered 2019-03-12 – 2019-03-14 (×5): 20 mg via INTRAVENOUS
  Filled 2019-03-12 (×5): qty 50

## 2019-03-12 MED ORDER — ONDANSETRON HCL 4 MG PO TABS
4.0000 mg | ORAL_TABLET | Freq: Four times a day (QID) | ORAL | Status: DC | PRN
  Administered 2019-03-14: 4 mg via ORAL
  Filled 2019-03-12: qty 1

## 2019-03-12 MED ORDER — ASPIRIN 81 MG PO CHEW
81.0000 mg | CHEWABLE_TABLET | Freq: Every day | ORAL | Status: DC
  Administered 2019-03-12 – 2019-03-16 (×5): 81 mg via ORAL
  Filled 2019-03-12 (×5): qty 1

## 2019-03-12 MED ORDER — CHLORHEXIDINE GLUCONATE CLOTH 2 % EX PADS: 6.0000 | MEDICATED_PAD | Freq: Every day | CUTANEOUS | Status: DC

## 2019-03-12 MED ORDER — MAGNESIUM OXIDE 400 (241.3 MG) MG PO TABS
800.0000 mg | ORAL_TABLET | Freq: Every day | ORAL | Status: DC
  Administered 2019-03-12 – 2019-03-15 (×4): 800 mg via ORAL
  Filled 2019-03-12 (×4): qty 2

## 2019-03-12 MED ORDER — PREDNISONE 20 MG PO TABS: 60.0000 mg | ORAL_TABLET | Freq: Once | ORAL | Status: DC

## 2019-03-12 MED ORDER — ALBUTEROL SULFATE (2.5 MG/3ML) 0.083% IN NEBU: 2.5000 mg | INHALATION_SOLUTION | Freq: Four times a day (QID) | RESPIRATORY_TRACT | Status: DC | PRN

## 2019-03-12 MED ORDER — CHLORHEXIDINE GLUCONATE CLOTH 2 % EX PADS
6.0000 | MEDICATED_PAD | Freq: Every day | CUTANEOUS | Status: DC
  Administered 2019-03-14 – 2019-03-15 (×2): 6 via TOPICAL

## 2019-03-12 MED ORDER — DIPHENHYDRAMINE HCL 50 MG PO CAPS
50.0000 mg | ORAL_CAPSULE | Freq: Three times a day (TID) | ORAL | Status: DC
  Administered 2019-03-12 – 2019-03-14 (×6): 50 mg via ORAL
  Filled 2019-03-12 (×6): qty 1

## 2019-03-12 MED ORDER — ONDANSETRON HCL 4 MG/2ML IJ SOLN
4.0000 mg | Freq: Once | INTRAMUSCULAR | Status: AC
  Administered 2019-03-12: 4 mg via INTRAVENOUS
  Filled 2019-03-12: qty 2

## 2019-03-12 MED ORDER — TAMSULOSIN HCL 0.4 MG PO CAPS
0.8000 mg | ORAL_CAPSULE | Freq: Every day | ORAL | Status: DC
  Administered 2019-03-12 – 2019-03-16 (×5): 0.8 mg via ORAL
  Filled 2019-03-12 (×5): qty 2

## 2019-03-12 MED ORDER — HYDROMORPHONE HCL 2 MG PO TABS
2.0000 mg | ORAL_TABLET | ORAL | Status: DC | PRN
  Administered 2019-03-13: 18:00:00 2 mg via ORAL
  Administered 2019-03-13: 12:00:00 4 mg via ORAL
  Administered 2019-03-14 (×2): 2 mg via ORAL
  Filled 2019-03-12 (×3): qty 1
  Filled 2019-03-12: qty 2

## 2019-03-12 MED ORDER — VILAZODONE HCL 20 MG PO TABS: 20.0000 mg | ORAL_TABLET | Freq: Every day | ORAL | Status: DC

## 2019-03-12 MED ORDER — SODIUM CHLORIDE 0.9 % IV SOLN
INTRAVENOUS | Status: DC
  Administered 2019-03-12 – 2019-03-15 (×8): via INTRAVENOUS

## 2019-03-12 MED ORDER — PROMETHAZINE HCL 25 MG/ML IJ SOLN
6.2500 mg | Freq: Three times a day (TID) | INTRAMUSCULAR | Status: DC | PRN
  Administered 2019-03-12 (×2): 6.25 mg via INTRAVENOUS
  Filled 2019-03-12 (×2): qty 1

## 2019-03-12 MED ORDER — FLUTICASONE PROPIONATE 50 MCG/ACT NA SUSP
2.0000 | Freq: Two times a day (BID) | NASAL | Status: DC | PRN
  Administered 2019-03-13 – 2019-03-14 (×2): 2 via NASAL
  Filled 2019-03-12 (×2): qty 16

## 2019-03-12 NOTE — Consult Note (Signed)
Reason for Consult: left hip eval Referring Physician: Tyrell Antonio, MD  Cole Gill is an 53 y.o. male.  HPI: 53 yo male well known to me for left THR about 2 months ago complicated by early MRSA infection.  He was taken to OR for I&D and poly and head exchange.  He was seen and evaluated by ID with plans for 6 weeks of IV Vanc and PO rifampin. Presented to ER this am with fever and skin rash associated with IV admin of Vanco.  Apparently they have had conversations with Dr. Prince Rome regarding this.  I came by to evaluate his hip  Past Medical History:  Diagnosis Date  . Anxiety   . Arthritis    hips knees fingers  . Depression   . Hypertension    Dr. Burnett Harry  . Sleep apnea    C-pap    Past Surgical History:  Procedure Laterality Date  . COLONOSCOPY  2015  . INCISION AND DRAINAGE Left 02/23/2019   Procedure: INCISION AND DRAINAGE DEEP LEFT HIP, HEAD BALL AND LINER EXCHANGE;  Surgeon: Paralee Cancel, MD;  Location: [facility];  Service: Orthopedics;  Laterality: Left;  . JOINT REPLACEMENT  2016  . KNEE ARTHROSCOPY WITH PATELLAR TENDON REPAIR Right 2017  . SHOULDER ARTHROSCOPY Left 2005  . TOTAL HIP ARTHROPLASTY Left 01/26/2019   Procedure: TOTAL HIP ARTHROPLASTY ANTERIOR APPROACH;  Surgeon: Paralee Cancel, MD;  Location: [facility];  Service: Orthopedics;  Laterality: Left;    History reviewed. No pertinent family history.  Social History:  reports that he has never smoked. He quit smokeless tobacco use about 6 months ago.  His smokeless tobacco use included chew. He reports previous alcohol use. He reports that he does not use drugs.  Allergies:  Allergies  Allergen Reactions  . Vancomycin Nausea Only and Other (See Comments)    Red man syndrome, headache    Medications:  I have reviewed the patient's current medications. Scheduled: . aspirin  81 mg Oral Daily  . [START ON 03/13/2019] Chlorhexidine Gluconate Cloth  6 each Topical Q0600  . [START ON 03/13/2019] Chlorhexidine Gluconate  Cloth  6 each Topical Q0600  . diphenhydrAMINE  50 mg Oral TID  . enoxaparin (LOVENOX) injection  40 mg Subcutaneous Q24H  . magnesium oxide  800 mg Oral QHS  . tamsulosin  0.8 mg Oral Daily  . Vilazodone HCl  20 mg Oral Daily    Results for orders placed or performed during the hospital encounter of 03/12/19 (from the past 24 hour(s))  CBC with Differential     Status: Abnormal   Collection Time: 03/12/19  5:15 AM  Result Value Ref Range   WBC 6.1 4.0 - 10.5 K/uL   RBC 3.99 (L) 4.22 - 5.81 MIL/uL   Hemoglobin 12.5 (L) 13.0 - 17.0 g/dL   HCT 37.7 (L) 39.0 - 52.0 %   MCV 94.5 80.0 - 100.0 fL   MCH 31.3 26.0 - 34.0 pg   MCHC 33.2 30.0 - 36.0 g/dL   RDW 14.1 11.5 - 15.5 %   Platelets 239 150 - 400 K/uL   nRBC 0.0 0.0 - 0.2 %   Neutrophils Relative % 87 %   Neutro Abs 5.3 1.7 - 7.7 K/uL   Lymphocytes Relative 5 %   Lymphs Abs 0.3 (L) 0.7 - 4.0 K/uL   Monocytes Relative 2 %   Monocytes Absolute 0.1 0.1 - 1.0 K/uL   Eosinophils Relative 5 %   Eosinophils Absolute 0.3 0.0 - 0.5 K/uL  Basophils Relative 0 %   Basophils Absolute 0.0 0.0 - 0.1 K/uL   WBC Morphology MILD LEFT SHIFT (1-5% METAS, OCC MYELO, OCC BANDS)    Immature Granulocytes 1 %   Abs Immature Granulocytes 0.03 0.00 - 0.07 K/uL  Comprehensive metabolic panel     Status: Abnormal   Collection Time: 03/12/19  5:15 AM  Result Value Ref Range   Sodium 132 (L) 135 - 145 mmol/L   Potassium 3.8 3.5 - 5.1 mmol/L   Chloride 95 (L) 98 - 111 mmol/L   CO2 26 22 - 32 mmol/L   Glucose, Bld 122 (H) 70 - 99 mg/dL   BUN 25 (H) 6 - 20 mg/dL   Creatinine, Ser 8.411.82 (H) 0.61 - 1.24 mg/dL   Calcium 8.7 (L) 8.9 - 10.3 mg/dL   Total Protein 7.6 6.5 - 8.1 g/dL   Albumin 3.8 3.5 - 5.0 g/dL   AST 324272 (H) 15 - 41 U/L   ALT 195 (H) 0 - 44 U/L   Alkaline Phosphatase 243 (H) 38 - 126 U/L   Total Bilirubin 2.7 (H) 0.3 - 1.2 mg/dL   GFR calc non Af Amer 41 (L) >60 mL/min   GFR calc Af Amer 47 (L) >60 mL/min   Anion gap 11 5 - 15  Lactic  acid, plasma     Status: None   Collection Time: 03/12/19  5:15 AM  Result Value Ref Range   Lactic Acid, Venous 1.6 0.5 - 1.9 mmol/L  SARS Coronavirus 2 (CEPHEID - Performed in [facility] hospital lab), Hosp Order     Status: None   Collection Time: 03/12/19  6:03 AM   Specimen: Nasopharyngeal Swab  Result Value Ref Range   SARS Coronavirus 2 NEGATIVE NEGATIVE  Acetaminophen level     Status: None   Collection Time: 03/12/19  7:42 AM  Result Value Ref Range   Acetaminophen (Tylenol), Serum 13 10 - 30 ug/mL  Urinalysis, Routine w reflex microscopic     Status: Abnormal   Collection Time: 03/12/19  7:42 AM  Result Value Ref Range   Color, Urine AMBER (A) YELLOW   APPearance HAZY (A) CLEAR   Specific Gravity, Urine 1.017 1.005 - 1.030   pH 5.0 5.0 - 8.0   Glucose, UA NEGATIVE NEGATIVE mg/dL   Hgb urine dipstick NEGATIVE NEGATIVE   Bilirubin Urine NEGATIVE NEGATIVE   Ketones, ur NEGATIVE NEGATIVE mg/dL   Protein, ur 30 (A) NEGATIVE mg/dL   Nitrite NEGATIVE NEGATIVE   Leukocytes,Ua NEGATIVE NEGATIVE   RBC / HPF 0-5 0 - 5 RBC/hpf   WBC, UA 0-5 0 - 5 WBC/hpf   Bacteria, UA RARE (A) NONE SEEN   Squamous Epithelial / LPF 0-5 0 - 5   Mucus PRESENT    Hyaline Casts, UA PRESENT   C-reactive protein     Status: Abnormal   Collection Time: 03/12/19  7:42 AM  Result Value Ref Range   CRP 17.3 (H) <1.0 mg/dL  Procalcitonin - Baseline     Status: None   Collection Time: 03/12/19  7:42 AM  Result Value Ref Range   Procalcitonin 1.69 ng/mL  Lactic acid, plasma     Status: None   Collection Time: 03/12/19 10:25 AM  Result Value Ref Range   Lactic Acid, Venous 1.8 0.5 - 1.9 mmol/L  MRSA PCR Screening     Status: None   Collection Time: 03/12/19 12:09 PM   Specimen: Nasal Mucosa; Nasopharyngeal  Result Value Ref Range  MRSA by PCR NEGATIVE NEGATIVE  Lactic acid, plasma     Status: None   Collection Time: 03/12/19 12:33 PM  Result Value Ref Range   Lactic Acid, Venous 1.0 0.5 - 1.9  mmol/L    X-ray: No new X-rays ordered today  ROS  As noted in HPI and recent history of his left THR  Blood pressure 139/67, pulse 88, temperature (!) 102.8 F (39.3 C), temperature source Oral, resp. rate 12, height 5\' 10"  (1.778 m), weight 103.2 kg, SpO2 99 %.  Physical Exam  Awake alert Obvious full body skin reaction/rash  Left hip exam: Incision healed, no drainage Some swelling in this area No significant surrounding erythema NVI  Assessment/Plan: 1. Status post I&D of left THR with findings of acute MRSA infection on Vanc and rifampin 2. Likely drug reaction versus PIC line infection, less likely recurrent hip infection  Plan: I will follow him during hospitalization  Would recommend ID re-consult to get their input on reaction and need for alternative antibiotic ? Cubicin or other. PT to get patient up moving No restrictions on activity from my perspective  Shelda PalMatthew D Torren Gill 03/12/2019, 5:13 PM

## 2019-03-12 NOTE — ED Notes (Signed)
Ortho at bedside.

## 2019-03-12 NOTE — Telephone Encounter (Signed)
The patient is being admitted to [facility] this morning with fever, rash, and ARF. I'm removing his PICC and redrawing blood cxs here. Thanks for your help though.

## 2019-03-12 NOTE — Progress Notes (Signed)
Consult received by ED for fever, rash, L hip pain. S/p I&D L hip with head and liner exchange on 7/14 by Dr. Alvan Dame for MRSA L hip PJI. Has PICC line. On Vanco and rifampin. EDP states patient will be admitted by hospitalist. Dr. Alvan Dame plans to see patient today.

## 2019-03-12 NOTE — ED Triage Notes (Signed)
Pt had hip replacement ans then became septic, he then received a PICC line for home antibiotics, he was receiving Vancomycin, he developed a fever and the rash shortly after the infusion, he received a peripheral line and was sick after using that access too, pt has had a fever at home, a generalized rash and hypotension

## 2019-03-12 NOTE — Consult Note (Signed)
[facility]       Reason for Consult: fever, rash, ARF/ALF    Referring Physician: Hartley BarefootBelkys Regalado, MD  Active Problems:   Drug reaction   Essential hypertension   AKI (acute kidney injury) (HCC)   Abnormal transaminases   Prosthetic hip infection (HCC)   Fever   . aspirin  81 mg Oral Daily  . [START ON 03/13/2019] Chlorhexidine Gluconate Cloth  6 each Topical Q0600  . [START ON 03/13/2019] Chlorhexidine Gluconate Cloth  6 each Topical Q0600  . diphenhydrAMINE  50 mg Oral TID  . enoxaparin (LOVENOX) injection  40 mg Subcutaneous Q24H  . magnesium oxide  800 mg Oral QHS  . tamsulosin  0.8 mg Oral Daily  . Vilazodone HCl  20 mg Oral Daily    Recommendations: 1. Fever -given the patient's acute renal failure, acute liver failure, and rash this appears to be most likely secondary to a drug reaction.  The patient had a negative COVID-19 PCR obtained in the ER and he is self quarantined at home since his discharge from the hospital earlier this month.  As he has no leukocytosis nor lactic acidosis, a PICC line infection appears less likely at this time.  Of note, the patient's most recent supply of vancomycin via elastomeric device appears to have malfunctioned, delivering his dose in 1 hour or less rather than the standard 90 to 120 minutes.  It is unlikely that he has vancomycin toxicity as his most recent vancomycin level obtained on July 26 was 6.7 with a creatinine of 1.1.  His PICC line has now been removed and will follow-up blood cultures x2.  The patient's rash will likely be slow to resolve but should improve the next several days.  I would avoid adding steroids for treatment of his rash until his blood cultures are negative at 48 hours.  We will adjust the patient's antibiotics as noted below.  2. LT hip PJI - Wound cx from OSH on 02/22/2019 as well as operative cxs from 02/23/2019 both grew MRSA, so he was discharged on vancomycin and rifampin following  his last hospital admisison. He underwent an immediate exchange of hardware aside from his retained acetabular cup (to which he did have a polyethylene liner exchange) on 02/23/2019. Given his issues with rash and fever, will dc both his vancomycin and rifampin at this time. Instead, will start daptomycin 6 mg/kg IV daily and monitor his fever, rash, renal function, and hepatic function over the next several days. Check a CK today prior to today's daptomycin dose and q week moving forward. I spoke with his wife on the phone today and shared with them both that one of the difficulties in treating MRSA is lack of numerous ABX options, so will likely keep him on daptomycin to complete his IV induction treatment.The latter will be extended with his PO consolidative ABX for 6 months thereafter should he tolerate the rifampin. Tentative d/c date for daptomycin will be 04/08/2019  3. ARF - The patient's Cr was 1.1 and his vancomycin trough was 6.7 on 03/07/2019. Due to his persistently low troughs, his vancomycin doses were increased each week. His most recent dosing was 1500 mg IV q 12 hrs, which was erroneously infusing in an hour rather than 90-120 minutes. Drug fever reaction suspected but given concommitant hepatic insufficiency, his rifampin may be playing a role as well. Check urine eosinophils to assess for AIN and agree with IV hydration for now. Will change ABX  to daptomycin monotherapy and check a CK prior to starting. Check BMP daily to trend Cr until WNL.  Assessment: The patient is a 53 y/o WM with OA who recently underwent a LT THA on 01/27/2019 and subsequently developed an early LT hip prosthetic joint infection due to MRSA who was re-admitted with fever, rash, ARF/ALF.   Antibiotics: Vancomycin, day 18 S/p zosyn and zyvox last PM in the ER.  HPI: Cole Gill is a 53 y.o. white male with OA, s/p LT THA on 02/02/7627 complicated by a MRSA LT hip PJI discharged on vancomycin and rifampin re-admitted  for fever, rash, myalgias, and ARF/ALF.  Following his initial hip arthroplasty on June 17, the patient was readmitted in mid July for an I&D to his left hip and polyethylene liner exchanges on February 23, 2019.  Subsequent operative cultures grew MRSA and the patient was discharged on vancomycin and oral rifampin via right arm PICC line.  As an outpatient his vancomycin levels remain persistently low with the most recent measuring 6.7 on July 26 with a concurrent creatinine of 1.1.  Adjustments to his dose were made and when the new supply of medication was started, the patient began developing myalgias, fevers, and a progressive rash. His rash was first noted on his RT arm near his PICC line but then spread to his chest . The following day, the rash spread to his abdomen, other arm, and both legs the following day. Unfortunately, the patient and his wife have missed at least 2 vancomycin doses at home recently due to concerns over appropriate dosing and now evolution of his symptoms including rash and fever.  Yesterday, the home health nurse was sent to his house to obtain blood cultures x2 and repeat vancomycin levels.  The patient noted feeling fevers and chills within minutes of infusing his medication via his PIC, so to his wife asked the infusion nurse to establish a peripheral IV and administer the medication that way.  The same symptoms began when the patient took the medication via a peripheral IV.  His rash intensified he became febrile to 102.4, prompting his evaluation in the ER last evening. His PICC was removed and blood cxs were obtained. His Cr was elevated to 1.82, LFTs were in the 200s, and his bilirubin was 2.7. He was empirically given both zyvox and zosyn in the ER prior to my assessment.  OP labs, ABX usage, cx results, fever curve, inpt labs, and imaging all independently reviewed  Review of Systems:  Review of Systems  Constitutional: Positive for chills and fever. Negative for weight  loss.  HENT: Negative for congestion, hearing loss, sinus pain and sore throat.   Eyes: Negative for blurred vision, photophobia and discharge.  Respiratory: Negative for cough, hemoptysis and shortness of breath.   Cardiovascular: Negative for chest pain, palpitations, orthopnea and leg swelling.  Gastrointestinal: Positive for nausea. Negative for abdominal pain, constipation, diarrhea, heartburn and vomiting.  Genitourinary: Negative for dysuria, flank pain, frequency and urgency.  Musculoskeletal: Positive for joint pain. Negative for back pain and myalgias.       LT hip  Skin: Positive for rash. Negative for itching.  Neurological: Positive for weakness. Negative for tremors, seizures and headaches.  Endo/Heme/Allergies: Negative for polydipsia. Does not bruise/bleed easily.  Psychiatric/Behavioral: Negative for depression and substance abuse. The patient is not nervous/anxious and does not have insomnia.      All other systems reviewed and are negative    Past Medical History:  Diagnosis Date  .  Anxiety   . Arthritis    hips knees fingers  . Depression   . Hypertension    Dr. Georgina Pillion  . Sleep apnea    C-pap    Social History   Tobacco Use  . Smoking status: Never Smoker  . Smokeless tobacco: Former Neurosurgeon    Types: Chew  Substance Use Topics  . Alcohol use: Not Currently    Frequency: Never    Comment: rare  . Drug use: Never    History reviewed. No pertinent family history.   Current Facility-Administered Medications:  .  0.9 %  sodium chloride infusion, , Intravenous, Continuous, Regalado, Belkys A, MD, Last Rate: 100 mL/hr at 03/12/19 1221 .  acetaminophen (TYLENOL) tablet 500 mg, 500 mg, Oral, Q8H PRN, Regalado, Belkys A, MD, 500 mg at 03/12/19 1023 .  albuterol (PROVENTIL) (2.5 MG/3ML) 0.083% nebulizer solution 2.5 mg, 2.5 mg, Nebulization, Q6H PRN, Regalado, Belkys A, MD .  aspirin chewable tablet 81 mg, 81 mg, Oral, Daily, Regalado, Belkys A, MD .  Melene Muller  ON 03/13/2019] Chlorhexidine Gluconate Cloth 2 % PADS 6 each, 6 each, Topical, Q0600, Regalado, Belkys A, MD .  Melene Muller ON 03/13/2019] Chlorhexidine Gluconate Cloth 2 % PADS 6 each, 6 each, Topical, Q0600, Regalado, Belkys A, MD .  diphenhydrAMINE (BENADRYL) capsule 50 mg, 50 mg, Oral, TID, Regalado, Belkys A, MD, 50 mg at 03/12/19 1423 .  diphenhydrAMINE (BENADRYL) injection 25 mg, 25 mg, Intravenous, Q6H PRN, Regalado, Belkys A, MD .  enoxaparin (LOVENOX) injection 40 mg, 40 mg, Subcutaneous, Q24H, Regalado, Belkys A, MD .  famotidine (PEPCID) IVPB 20 mg premix, 20 mg, Intravenous, Q12H, Regalado, Belkys A, MD, Last Rate: 100 mL/hr at 03/12/19 0854, 20 mg at 03/12/19 0854 .  fluticasone (FLONASE) 50 MCG/ACT nasal spray 2 spray, 2 spray, Each Nare, BID PRN, Regalado, Belkys A, MD .  HYDROmorphone (DILAUDID) tablet 2-4 mg, 2-4 mg, Oral, Q4H PRN, Regalado, Belkys A, MD .  magnesium oxide (MAG-OX) tablet 800 mg, 800 mg, Oral, QHS, Regalado, Belkys A, MD .  methocarbamol (ROBAXIN) tablet 500 mg, 500 mg, Oral, Q6H PRN, Regalado, Belkys A, MD .  ondansetron (ZOFRAN) tablet 4 mg, 4 mg, Oral, Q6H PRN **OR** ondansetron (ZOFRAN) injection 4 mg, 4 mg, Intravenous, Q6H PRN, Regalado, Belkys A, MD .  promethazine (PHENERGAN) injection 6.25 mg, 6.25 mg, Intravenous, Q8H PRN, Regalado, Belkys A, MD, 6.25 mg at 03/12/19 1226 .  tamsulosin (FLOMAX) capsule 0.8 mg, 0.8 mg, Oral, Daily, Regalado, Belkys A, MD .  Vilazodone HCl TABS 20 mg, 20 mg, Oral, Daily, Regalado, Belkys A, MD  Allergies  Allergen Reactions  . Vancomycin Nausea Only and Other (See Comments)    Red man syndrome, headache    Vitals:   03/12/19 1100 03/12/19 1200  BP: 118/68 (!) 119/58  Pulse: 98 85  Resp: 18 12  Temp:  (!) 102 F (38.9 C)  SpO2: 97% 91%     Physical Exam Gen: withdrawn, mild distress secondary to disseminated rash and fever, A&Ox 3 Head: NCAT, no temporal wasting evident EENT: PERRL, EOMI, MMM, adequate dentition  Neck: supple, no JVD CV: NRRR, no murmurs evident Pulm: CTA bilaterally, no wheeze or retractions Abd: soft, NTND, +BS Extrems:  trace LE edema, 2+ pulses MSK: LT hip incision healing well Skin: disseminated maculopapular/vasculitic rash to all extremities, chest, abdomen, and back, adequate skin turgor, facial skin is hyperemic but without focal lesions Neuro: CN II-XII grossly intact, no focal neurologic deficits appreciated, gait was not assessed, A&Ox  3   Lab Results  Component Value Date   WBC 6.1 03/12/2019   HGB 12.5 (L) 03/12/2019   HCT 37.7 (L) 03/12/2019   MCV 94.5 03/12/2019   PLT 239 03/12/2019    Lab Results  Component Value Date   CREATININE 1.82 (H) 03/12/2019   BUN 25 (H) 03/12/2019   NA 132 (L) 03/12/2019   K 3.8 03/12/2019   CL 95 (L) 03/12/2019   CO2 26 03/12/2019    Lab Results  Component Value Date   ALT 195 (H) 03/12/2019   AST 272 (H) 03/12/2019   ALKPHOS 243 (H) 03/12/2019     Microbiology: Recent Results (from the past 240 hour(s))  SARS Coronavirus 2 (CEPHEID - Performed in [facility] hospital lab), Hosp Order     Status: None   Collection Time: 03/12/19  6:03 AM   Specimen: Nasopharyngeal Swab  Result Value Ref Range Status   SARS Coronavirus 2 NEGATIVE NEGATIVE Final    Comment: (NOTE) If result is NEGATIVE SARS-CoV-2 target nucleic acids are NOT DETECTED. The SARS-CoV-2 RNA is generally detectable in upper and lower  respiratory specimens during the acute phase of infection. The lowest  concentration of SARS-CoV-2 viral copies this assay can detect is 250  copies / mL. A negative result does not preclude SARS-CoV-2 infection  and should not be used as the sole basis for treatment or other  patient management decisions.  A negative result may occur with  improper specimen collection / handling, submission of specimen other  than nasopharyngeal swab, presence of viral mutation(s) within the  areas targeted by this assay, and inadequate  number of viral copies  (<250 copies / mL). A negative result must be combined with clinical  observations, patient history, and epidemiological information. If result is POSITIVE SARS-CoV-2 target nucleic acids are DETECTED. The SARS-CoV-2 RNA is generally detectable in upper and lower  respiratory specimens dur ing the acute phase of infection.  Positive  results are indicative of active infection with SARS-CoV-2.  Clinical  correlation with patient history and other diagnostic information is  necessary to determine patient infection status.  Positive results do  not rule out bacterial infection or co-infection with other viruses. If result is PRESUMPTIVE POSTIVE SARS-CoV-2 nucleic acids MAY BE PRESENT.   A presumptive positive result was obtained on the submitted specimen  and confirmed on repeat testing.  While 2019 novel coronavirus  (SARS-CoV-2) nucleic acids may be present in the submitted sample  additional confirmatory testing may be necessary for epidemiological  and / or clinical management purposes  to differentiate between  SARS-CoV-2 and other Sarbecovirus currently known to infect humans.  If clinically indicated additional testing with an alternate test  methodology [phone] is advised. The SARS-CoV-2 RNA is generally  detectable in upper and lower respiratory sp ecimens during the acute  phase of infection. The expected result is Negative. Fact Sheet for Patients:  BoilerBrush.com.cyhttps://www.fda.gov/media/136312/download Fact Sheet for Healthcare Providers: https://pope.com/https://www.fda.gov/media/136313/download This test is not yet approved or cleared by the [place] FDA and has been authorized for detection and/or diagnosis of SARS-CoV-2 by FDA under an Emergency Use Authorization (EUA).  This EUA will remain in effect (meaning this test can be used) for the duration of the COVID-19 declaration under Section 564(b)(1) of the Act, 21 U.S.C. section 360bbb-3(b)(1), unless the  authorization is terminated or revoked sooner. Performed at [facility], [address] [place], [place] [address]   MRSA PCR Screening     Status:  None   Collection Time: 03/12/19 12:09 PM   Specimen: Nasal Mucosa; Nasopharyngeal  Result Value Ref Range Status   MRSA by PCR NEGATIVE NEGATIVE Final    Comment:        The GeneXpert MRSA Assay (FDA approved for NASAL specimens only), is one component of a comprehensive MRSA colonization surveillance program. It is not intended to diagnose MRSA infection nor to guide or monitor treatment for MRSA infections. Performed at [facility], [address] [place], [place] [address]     [place] LionsJames N Powers, MD [facility] [email or web address] 03/12/2019, 3:12 PM

## 2019-03-12 NOTE — ED Provider Notes (Addendum)
TIME SEEN: 5:11 AM  CHIEF COMPLAINT: Fever, increasing left hip pain, hypotension, rash  HPI: Patient is a 53 year old male with history of hypertension who presents to the emergency department with complaints of fever of 101 at home, hypotension with systolic blood pressures in the 90s, increasing left hip pain and swelling, diffuse rash.  Patient underwent left hip replacement with Dr. Alvan Dame on 01/26/2019.  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 Alvan Dame.  Cultures grew MRSA resistant to clindamycin.  He was started on vancomycin 1250 mg IV every 12 hours for 6 weeks (end date 04/08/19) and rifampin 300 mg p.o. twice daily x 6 weeks.  Reports that he had his vancomycin increased to 1500 mg twice daily on 03/04/2019.  Patient and wife are convinced that this is what is causing his symptoms.  They state ever since his vancomycin was increased he started having pain when using his left upper extremity PICC line.  He also developed a diffuse nonpruritic rash.  He started having fevers and hypotension today.  He had blood cultures drawn by his home health nurse peripherally and from the PICC line.  He had a peripheral line placed in his right upper extremity to start giving the vancomycin through.  Family reports they did not give him his second dose of vancomycin today because they are concerned that that is what is causing his symptoms.  He denies any headache, neck pain or neck stiffness, chest pain, shortness of breath, cough, vomiting, diarrhea, abdominal pain, dysuria.  No sick contacts.  No recent tick bites.  Wife reports that his left hip appears to be more swollen, red and warm today over the incision site.  She has been expressing blood clots from his incision today.  No purulent drainage.  ROS: See HPI Constitutional:  fever  Eyes: no drainage  ENT: no runny nose   Cardiovascular:  no chest pain  Resp: no SOB  GI: no  vomiting GU: no dysuria Integumentary:  rash  Allergy: no hives  Musculoskeletal: no leg swelling  Neurological: no slurred speech ROS otherwise negative  PAST MEDICAL HISTORY/PAST SURGICAL HISTORY:  Past Medical History:  Diagnosis Date  . Anxiety   . Arthritis    hips knees fingers  . Depression   . Hypertension    Dr. Burnett Harry  . Sleep apnea    C-pap    MEDICATIONS:  Prior to Admission medications   Medication Sig Start Date End Date Taking? Authorizing Provider  acetaminophen (TYLENOL) 500 MG tablet Take 1,000 mg by mouth every 6 (six) hours as needed for moderate pain.    [provider]  amLODIPine-Valsartan-HCTZ 10-320-25 MG TABS Take 1 tablet by mouth daily.    [provider]  aspirin (ASPIRIN CHILDRENS) 81 MG chewable tablet Chew 1 tablet (81 mg total) by mouth 2 (two) times daily. Take for 4 weeks, then resume regular dose. 02/27/19 03/29/19  Danae Orleans, PA-C  celecoxib (CELEBREX) 200 MG capsule Take 200 mg by mouth 2 (two) times daily.     [provider]  docusate sodium (COLACE) 100 MG capsule Take 1 capsule (100 mg total) by mouth 2 (two) times daily. 02/26/19   Danae Orleans, PA-C  ferrous sulfate (FERROUSUL) 325 (65 FE) MG tablet Take 1 tablet (325 mg total) by mouth 3 (three) times daily with meals for 14 days. 02/26/19 03/12/19  Danae Orleans, PA-C  fluticasone (FLONASE) 50 MCG/ACT nasal spray Place  2 sprays into both nostrils 2 (two) times daily as needed for allergies.     [provider]  HYDROmorphone (DILAUDID) 2 MG tablet Take 1-2 tablets (2-4 mg total) by mouth every 4 (four) hours as needed for severe pain. 02/26/19   Danae Orleans, PA-C  Magnesium 400 MG TABS Take 800 mg by mouth at bedtime.    [provider]  methocarbamol (ROBAXIN) 500 MG tablet Take 1 tablet (500 mg total) by mouth every 6 (six) hours as needed for muscle spasms. 02/26/19   Danae Orleans, PA-C  Pediatric Multivitamins-Iron (FRUITY  CHEWS/IRON) CHEW Chew 1 tablet by mouth daily.    [provider]  polyethylene glycol (MIRALAX / GLYCOLAX) 17 g packet Take 17 g by mouth 2 (two) times daily. 02/26/19   Danae Orleans, PA-C  rifampin (RIFADIN) 300 MG capsule Take 1 capsule (300 mg total) by mouth 2 (two) times daily. 02/26/19 03/28/19  Powers, Evern Core, MD  sodium chloride (OCEAN) 0.65 % SOLN nasal spray Place 1 spray into both nostrils daily as needed for congestion.    [provider]  tamsulosin (FLOMAX) 0.4 MG CAPS capsule Take 0.8 mg by mouth daily.     [provider]  testosterone cypionate (DEPOTESTOSTERONE CYPIONATE) 200 MG/ML injection Inject 100 mg into the muscle once a week.     [provider]  vancomycin IVPB Inject 1,250 mg into the vein every 12 (twelve) hours. Indication: MRSA prosthetic joint infection of hip Last Day of Therapy:  04/07/2019 Labs - Sunday/Monday:  CBC/D, BMP, and vancomycin trough. Labs - Thursday:  CMP and vancomycin trough Labs - Every other week:  ESR and CRP 02/26/19 04/07/19  Danae Orleans, PA-C  Vilazodone HCl (VIIBRYD) 20 MG TABS Take 20 mg by mouth daily.    [provider]    ALLERGIES:  No Known Allergies  SOCIAL HISTORY:  Social History   Tobacco Use  . Smoking status: Never Smoker  . Smokeless tobacco: Former Systems developer    Types: Chew  Substance Use Topics  . Alcohol use: Not Currently    Frequency: Never    Comment: rare    FAMILY HISTORY: History reviewed. No pertinent family history.  EXAM: BP 112/78 (BP Location: Left Arm)   Pulse 88   Temp 99.3 F (37.4 C) (Oral)   Resp 18   SpO2 95%  CONSTITUTIONAL: Alert and oriented and responds appropriately to questions. Well-appearing; well-nourished HEAD: Normocephalic EYES: Conjunctivae clear, pupils appear equal, EOMI ENT: normal nose; moist mucous membranes, no angioedema, normal phonation, no stridor NECK: Supple, no meningismus, no nuchal rigidity, no LAD  CARD: RRR; S1  and S2 appreciated; no murmurs, no clicks, no rubs, no gallops RESP: Normal chest excursion without splinting or tachypnea; breath sounds clear and equal bilaterally; no wheezes, no rhonchi, no rales, no hypoxia or respiratory distress, speaking full sentences ABD/GI: Normal bowel sounds; non-distended; soft, non-tender, no rebound, no guarding, no peritoneal signs, no hepatosplenomegaly BACK:  The back appears normal and is non-tender to palpation, there is no CVA tenderness EXT: Normal ROM in all joints; non-tender to palpation; no edema; normal capillary refill; no cyanosis, no calf tenderness or swelling.  Patient has a PICC line in the left upper extremity that has no surrounding tenderness, redness, warmth, drainage, bruising, swelling.  2+ radial and DP pulses bilaterally.   Patient does have soft tissue swelling over the anterior lateral left hip.  It is not red over this area but he does have scattered rash  consistent with the rashes on the rest of his body.  It is warm to palpation over the hip.  The incision appears intact and there is no active drainage or bleeding noted.  He is tender to palpation over this area.  He is able to move the hip without significant pain. SKIN: Normal color for age and race; warm; diffuse scattered erythematous maculopapular rash to his extremities and torso sparing the palms, soles and face.  No involvement of the mucous membranes.  There is no petechiae, purpura.  No hives.  No blisters or desquamation.  No diffuse erythema. NEURO: Moves all extremities equally PSYCH: The patient's mood and manner are appropriate. Grooming and personal hygiene are appropriate.  MEDICAL DECISION MAKING: Patient here with complaints of fevers, hypotension.  He is also had a rash for several days that he attributes to increasing his vancomycin dose on the 23rd.  This does not look like red man syndrome.  This could be a drug reaction but does not look like anaphylaxis.  I am concerned  for possible sepsis.  He does have multiple potential sources including his left hip but is also his PICC line given he says the PICC line is very uncomfortable when it gets flushed.  Will obtain labs, cultures today.  He refuses to allow [place] to draw culture of his PICC line.  He refuses a dose of vancomycin and states that he missed his vancomycin tonight.  His blood pressure is currently normal and he is afebrile after taking antipyretics at home.  Discussed with pharmacy who recommends Linezolid and Zosyn for broad coverage.  ED PROGRESS: Patient's labs show no leukocytosis.  Normal lactate.  He is starting to have chills again.  Will recheck temperature.  His liver function tests are significantly elevated.  He denies abdominal pain.  Reports he has been taking Tylenol regularly for fever but does not think that he has been taking more than 4000 mg in a 24-hour.  He does report that his urine has been darker than normal.  He states that this is due to rifampin but it still appears darker than it has been.  This could be from elevated bilirubin level.  He reports he still has a gallbladder.  No vomiting.  Will obtain right upper quadrant ultrasound and check Tylenol level.  Patient does have mild acute renal failure today.  We will continue to hydrate patient.  His COVID swab is negative.  X-ray of the hip shows no acute abnormality.  We will add on chest x-ray, RMSF titers, hepatitis panel.  Will discuss with medicine for admission.   7:32 AM Discussed patient's case with hospitalist, Dr. Tyrell Antonio.  I have recommended admission and patient (and family if present) agree with this plan. Admitting physician will place admission orders.   I reviewed all nursing notes, vitals, pertinent previous records, EKGs, lab and urine results, imaging (as available).   Right upper quadrant ultrasound negative.  7:59 AM  D/w Dr. Lyla Glassing on call for orthopedics for Dr. Alvan Dame.  It appears per [place] note that he has  updated Alvan Dame to see in hospital.  Appreciate ortho help.                 Ward, Delice Bison, DO 03/12/19 0732    Ward, Delice Bison, DO 03/12/19 0800

## 2019-03-12 NOTE — Progress Notes (Signed)
CPAP offered to patient who refuses stating that he does not wear one at home, just sleeps in the recliner.  Will be available if he changes his mind.

## 2019-03-12 NOTE — H&P (Addendum)
History and Physical  Cole Gill ZOX:[id number] DOB: [birth year] DOA: 03/12/2019  PCP: System, Pcp Not In Patient coming from: Home  I have personally briefly reviewed patient's old medical records in [place]   Chief Complaint: Fever, hypotension, rash.   HPI: Cole Gill is a 53 y.o. male with past medical history significant for hypertension, sleep apnea, left hip replacement by Dr. Noralee Chars on 01/26/2019.  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  Cultures grew with MRSA.  He was started on IV vancomycin and discharged home to complete 6 weeks of IV antibiotics and rifampin.  Patient reports that his vancomycin dose was increased to 1500 mg twice a day on March 04, 2019.  Patient and wife report that on Tuesday, Wednesday after vancomycin infusion he developed fever, headache, nausea, joint pain.  The afternoon of Thursday he developed a rash.  They stop using the PICC line thinking that that was causing the problem.  He had a new IV access on his right arm place by home health.  On Thursday night he received his vancomycin and after that he developed a fever, chills nausea and worsening generalized rash.   He reported mild shortness of breath on exertion.  But at rest he denies any shortness of breath.  No feeling of throat closing.  He does report like GERD-like symptoms.  He denies chest pain.  He is still having pain on the right hip.  He has been taking Tylenol for fever.  They report low blood pressure at home with systolic blood pressure in the 90s.  Evaluation in the ED: Sodium 132, potassium 3.8, BUN 25, creatinine 1.8, alkaline phosphatase 243, AST 272, ALT 195, bilirubin 2.7, lactic acid 1.6, white blood cell 6.1, hemoglobin 12.5, platelets 239.  UA with 30 protein, no significant white blood cells.  Chest x-ray; mild pulmonary vascular congestion.  Abdominal ultrasound no acute abnormality.    Review of Systems: All systems reviewed and apart from history of presenting illness, are negative.  Past Medical History:  Diagnosis Date  . Anxiety   . Arthritis    hips knees fingers  . Depression   . Hypertension    Dr. Burnett Harry  . Sleep apnea    C-pap   Past Surgical History:  Procedure Laterality Date  . COLONOSCOPY  2015  . INCISION AND DRAINAGE Left 02/23/2019   Procedure: INCISION AND DRAINAGE DEEP LEFT HIP, HEAD BALL AND LINER EXCHANGE;  Surgeon: Paralee Cancel, MD;  Location: [facility];  Service: Orthopedics;  Laterality: Left;  . JOINT REPLACEMENT  2016  . KNEE ARTHROSCOPY WITH PATELLAR TENDON REPAIR Right 2017  . SHOULDER ARTHROSCOPY Left 2005  . TOTAL HIP ARTHROPLASTY Left 01/26/2019   Procedure: TOTAL HIP ARTHROPLASTY ANTERIOR APPROACH;  Surgeon: Paralee Cancel, MD;  Location: [facility];  Service: Orthopedics;  Laterality: Left;   Social History:  reports that he has never smoked. He quit smokeless tobacco use about 6 months ago.  His smokeless tobacco use included chew. He reports previous alcohol use. He reports that he does not use drugs.   Allergies  Allergen Reactions  . Vancomycin Nausea Only and Other (See Comments)    Red man syndrome, headache    Family history: Mother ; colon cancer. Father died Heart attack. History HTN in family.   Prior to Admission medications   Medication Sig Start Date End Date Taking? Authorizing Provider  acetaminophen (TYLENOL) 500  MG tablet Take 1,000 mg by mouth every 6 (six) hours as needed for moderate pain.   Yes [provider]  amLODIPine-Valsartan-HCTZ 10-320-25 MG TABS Take 1 tablet by mouth daily.   Yes [provider]  aspirin (ASPIRIN CHILDRENS) 81 MG chewable tablet Chew 1 tablet (81 mg total) by mouth 2 (two) times daily. Take for 4 weeks, then resume regular dose. Patient taking differently: Chew 81 mg by mouth daily.  02/27/19 03/29/19 Yes Babish, Rodman Key, PA-C  celecoxib (CELEBREX) 200 MG capsule Take  200 mg by mouth 2 (two) times daily.    Yes [provider]  docusate sodium (COLACE) 100 MG capsule Take 1 capsule (100 mg total) by mouth 2 (two) times daily. 02/26/19  Yes Babish, Rodman Key, PA-C  ferrous sulfate (FERROUSUL) 325 (65 FE) MG tablet Take 1 tablet (325 mg total) by mouth 3 (three) times daily with meals for 14 days. Patient taking differently: Take 325 mg by mouth daily.  02/26/19 03/12/19 Yes Babish, Rodman Key, PA-C  fluticasone (FLONASE) 50 MCG/ACT nasal spray Place 2 sprays into both nostrils 2 (two) times daily as needed for allergies.    Yes [provider]  HYDROmorphone (DILAUDID) 2 MG tablet Take 1-2 tablets (2-4 mg total) by mouth every 4 (four) hours as needed for severe pain. 02/26/19  Yes Danae Orleans, PA-C  Magnesium 400 MG TABS Take 800 mg by mouth at bedtime.   Yes [provider]  methocarbamol (ROBAXIN) 500 MG tablet Take 1 tablet (500 mg total) by mouth every 6 (six) hours as needed for muscle spasms. 02/26/19  Yes Babish, Rodman Key, PA-C  polyethylene glycol (MIRALAX / GLYCOLAX) 17 g packet Take 17 g by mouth 2 (two) times daily. 02/26/19  Yes Babish, Rodman Key, PA-C  pseudoephedrine (SUDAFED) 30 MG tablet Take 30 mg by mouth daily as needed for congestion.   Yes [provider]  rifampin (RIFADIN) 300 MG capsule Take 1 capsule (300 mg total) by mouth 2 (two) times daily. 02/26/19 03/28/19 Yes Powers, Evern Core, MD  tamsulosin (FLOMAX) 0.4 MG CAPS capsule Take 0.8 mg by mouth daily.    Yes [provider]  testosterone cypionate (DEPOTESTOSTERONE CYPIONATE) 200 MG/ML injection Inject 100 mg into the muscle once a week.    Yes [provider]  vancomycin IVPB Inject 1,250 mg into the vein every 12 (twelve) hours. Indication: MRSA prosthetic joint infection of hip Last Day of Therapy:  04/07/2019 Labs - Sunday/Monday:  CBC/D, BMP, and vancomycin trough. Labs - Thursday:  CMP and vancomycin trough Labs - Every other week:  ESR and  CRP 02/26/19 04/07/19 Yes Babish, Rodman Key, PA-C  Vilazodone HCl (VIIBRYD) 20 MG TABS Take 20 mg by mouth daily.   Yes [provider]   Physical Exam: Vitals:   03/12/19 0408 03/12/19 0830 03/12/19 0852 03/12/19 0853  BP: 112/78 121/80  135/67  Pulse: 88     Resp: 18 14  18   Temp: 99.3 F (37.4 C)  (!) 101.8 F (38.8 C)   TempSrc: Oral  Rectal   SpO2: 95% 92%  94%     General exam: Moderately built and nourished patient, lying comfortably supine on the gurney in no obvious distress.  Head, eyes and ENT: Nontraumatic and normocephalic. Pupils equally reacting to light and accommodation. Oral mucosa moist.  Neck: Supple. No JVD, carotid bruit or thyromegaly.  Lymphatics: No lymphadenopathy.  Respiratory system: Clear to auscultation. No increased work of breathing.  Cardiovascular system: S1 and S2 heard, RRR. No  JVD, murmurs, gallops, clicks or pedal edema.  Gastrointestinal system: Abdomen is nondistended, soft and nontender. Normal bowel sounds heard. No organomegaly or masses appreciated.  Central nervous system: Alert and oriented. No focal neurological deficits.  Extremities: Right hip with dressing, mild redness no drainage  Skin: Generalized macular papular rash  Musculoskeletal system: Negative exam.  Psychiatry: Pleasant and cooperative.   Labs on Admission:  Basic Metabolic Panel: Recent Labs  Lab 03/12/19 0515  NA 132*  K 3.8  CL 95*  CO2 26  GLUCOSE 122*  BUN 25*  CREATININE 1.82*  CALCIUM 8.7*   Liver Function Tests: Recent Labs  Lab 03/12/19 0515  AST 272*  ALT 195*  ALKPHOS 243*  BILITOT 2.7*  PROT 7.6  ALBUMIN 3.8   No results for input(s): LIPASE, AMYLASE in the last 168 hours. No results for input(s): AMMONIA in the last 168 hours. CBC: Recent Labs  Lab 03/12/19 0515  WBC 6.1  NEUTROABS 5.3  HGB 12.5*  HCT 37.7*  MCV 94.5  PLT 239   Cardiac Enzymes: No results for input(s): CKTOTAL, CKMB, CKMBINDEX, TROPONINI in  the last 168 hours.  BNP (last 3 results) No results for input(s): PROBNP in the last 8760 hours. CBG: No results for input(s): GLUCAP in the last 168 hours.  Radiological Exams on Admission: Dg Chest Portable 1 View  Result Date: 03/12/2019 CLINICAL DATA:  Fever. EXAM: PORTABLE CHEST 1 VIEW COMPARISON:  None. FINDINGS: UPPER limits normal heart size noted. Mild pulmonary vascular congestion is noted. A LEFT central venous catheter is noted with tip overlying the LOWER SVC. There is no evidence of focal airspace disease, pulmonary edema, suspicious pulmonary nodule/mass, pleural effusion, or pneumothorax. Probable degenerative changes within both shoulders noted. IMPRESSION: UPPER limits normal heart size and mild pulmonary vascular congestion. Electronically Signed   By: Margarette Canada M.D.   On: 03/12/2019 08:39   Dg Hip Port Unilat W Or Wo Pelvis 1 View Left  Result Date: 03/12/2019 CLINICAL DATA:  Left hip replacement. Postop infection. Increase swelling and redness. EXAM: DG HIP (WITH OR WITHOUT PELVIS) 1V PORT LEFT COMPARISON:  No recent prior. FINDINGS: Total left hip replacement. Hardware intact. Anatomic alignment. Stable tiny bony density noted adjacent to the lower portion of the acetabulum. No acute bony abnormality. No erosive changes. IMPRESSION: Total left hip replacement with stable appearance. No acute bony abnormality identified. No bony erosions noted. Electronically Signed   By: Marcello Moores  Register   On: 03/12/2019 06:07   US Abdomen Limited Ruq  Result Date: 03/12/2019 CLINICAL DATA:  Elevated liver function test EXAM: ULTRASOUND ABDOMEN LIMITED RIGHT UPPER QUADRANT COMPARISON:  None. FINDINGS: Gallbladder: No gallstones or wall thickening visualized. No sonographic Murphy sign noted by sonographer. Common bile duct: Diameter: 3 mm Liver: No focal lesion identified. Within normal limits in parenchymal echogenicity. Portal vein is patent on color Doppler imaging with normal direction  of blood flow towards the liver. IMPRESSION: Normal right upper quadrant ultrasound. Electronically Signed   By: Monte Fantasia M.D.   On: 03/12/2019 07:29    EKG: No EKG available  Assessment/Plan Active Problems:   Drug reaction   Essential hypertension   AKI (acute kidney injury) (HCC)   Abnormal transaminases   Prosthetic hip infection (HCC)   Fever  1-Generalized rash, macular papular;  This could be related drug rash reaction. So far no symptoms of anaphylaxis: He denies shortness of breath, not feeling of throat closing, no tongue swelling. Agree with stopping vancomycin for now.  Schedule Benadryl and IV Pepcid. Discussed with ID, will hold on as steroids in case that patient is having gram-negative bacteremia, to avoid worsening infection.  2-Fevers; this could be related to infection, versus drug reaction. Blood cultures ordered. Discussed with ID will remove PICC line. Supportive care. Patient has received Zyvox and Zosyn in the ED.  Further antibiotics per Dr. Prince Rome. Check CRP, procalcitonin level.  3-Right hip infection, history of MRSA Appreciate Dr. Jenel Lucks evaluation. Further antibiotics per Dr. Prince Rome   4-AKI: Patient presented with a creatinine at 1.8.  Prior creatinine 1.1 This could be related to medication, combination of vancomycin and hydrochlorothiazide, ARB  use.  Also poor oral intake. Hold  ARB and hydrochlorothiazide. Continue with IV fluids. Strict I's and O's.[name]  5-Transaminases;  Could be related to medications, hypotension Right upper quadrant ultrasound negative. Repeat levels in the morning. Tylenol level 13.  No significant elevated..  6-Hyponatremia: Continue with IV fluids.   DVT Prophylaxis: Lovenox Code Status: Full code  Family Communication: Wife at bedside Disposition Plan: Admit to the stepdown unit, monitor for anaphylaxis reaction, treat with IV fluids, Benadryl.  Work-up for infectious in process.  Further  antibiotics  per infectious disease  Time spent: 75 minutes.    Elmarie Shiley MD [facility]   03/12/2019, 9:24 AM

## 2019-03-12 NOTE — ED Notes (Signed)
IV team nurse at bedside. 

## 2019-03-13 DIAGNOSIS — E871 Hypo-osmolality and hyponatremia: Secondary | ICD-10-CM

## 2019-03-13 DIAGNOSIS — T8452XA Infection and inflammatory reaction due to internal left hip prosthesis, initial encounter: Secondary | ICD-10-CM

## 2019-03-13 DIAGNOSIS — B9562 Methicillin resistant Staphylococcus aureus infection as the cause of diseases classified elsewhere: Secondary | ICD-10-CM

## 2019-03-13 DIAGNOSIS — T368X5A Adverse effect of other systemic antibiotics, initial encounter: Secondary | ICD-10-CM

## 2019-03-13 DIAGNOSIS — Z96642 Presence of left artificial hip joint: Secondary | ICD-10-CM

## 2019-03-13 DIAGNOSIS — T8459XD Infection and inflammatory reaction due to other internal joint prosthesis, subsequent encounter: Secondary | ICD-10-CM

## 2019-03-13 DIAGNOSIS — T50905D Adverse effect of unspecified drugs, medicaments and biological substances, subsequent encounter: Secondary | ICD-10-CM

## 2019-03-13 DIAGNOSIS — Z96649 Presence of unspecified artificial hip joint: Secondary | ICD-10-CM

## 2019-03-13 DIAGNOSIS — R748 Abnormal levels of other serum enzymes: Secondary | ICD-10-CM

## 2019-03-13 DIAGNOSIS — I10 Essential (primary) hypertension: Secondary | ICD-10-CM

## 2019-03-13 DIAGNOSIS — D649 Anemia, unspecified: Secondary | ICD-10-CM

## 2019-03-13 DIAGNOSIS — L270 Generalized skin eruption due to drugs and medicaments taken internally: Principal | ICD-10-CM

## 2019-03-13 DIAGNOSIS — M00052 Staphylococcal arthritis, left hip: Secondary | ICD-10-CM

## 2019-03-13 LAB — COMPREHENSIVE METABOLIC PANEL
ALT: 130 U/L — ABNORMAL HIGH (ref 0–44)
AST: 120 U/L — ABNORMAL HIGH (ref 15–41)
Albumin: 2.8 g/dL — ABNORMAL LOW (ref 3.5–5.0)
Alkaline Phosphatase: 194 U/L — ABNORMAL HIGH (ref 38–126)
Anion gap: 9 (ref 5–15)
BUN: 17 mg/dL (ref 6–20)
CO2: 24 mmol/L (ref 22–32)
Calcium: 7.7 mg/dL — ABNORMAL LOW (ref 8.9–10.3)
Chloride: 97 mmol/L — ABNORMAL LOW (ref 98–111)
Creatinine, Ser: 1.28 mg/dL — ABNORMAL HIGH (ref 0.61–1.24)
GFR calc Af Amer: >60 mL/min (ref >60)
GFR calc non Af Amer: >60 mL/min (ref >60)
Glucose, Bld: 104 mg/dL — ABNORMAL HIGH (ref 70–99)
Potassium: 3.7 mmol/L (ref 3.5–5.1)
Sodium: 130 mmol/L — ABNORMAL LOW (ref 135–145)
Total Bilirubin: 1.6 mg/dL — ABNORMAL HIGH (ref 0.3–1.2)
Total Protein: 5.5 g/dL — ABNORMAL LOW (ref 6.5–8.1)

## 2019-03-13 LAB — CBC
HCT: 31.8 % — ABNORMAL LOW (ref 39.0–52.0)
Hemoglobin: 10.4 g/dL — ABNORMAL LOW (ref 13.0–17.0)
MCH: 31 pg (ref 26.0–34.0)
MCHC: 32.7 g/dL (ref 30.0–36.0)
MCV: 94.9 fL (ref 80.0–100.0)
Platelets: 179 10*3/uL (ref 150–400)
RBC: 3.35 MIL/uL — ABNORMAL LOW (ref 4.22–5.81)
RDW: 14 % (ref 11.5–15.5)
WBC: 4.3 10*3/uL (ref 4.0–10.5)
nRBC: 0 % (ref 0.0–0.2)

## 2019-03-13 LAB — URINE CULTURE: Culture: NO GROWTH

## 2019-03-13 LAB — HEPATITIS PANEL, ACUTE
HCV Ab: <0.1 s/co ratio (ref 0.0–0.9)
Hep A IgM: NEGATIVE
Hep B C IgM: NEGATIVE
Hepatitis B Surface Ag: NEGATIVE

## 2019-03-13 LAB — PROCALCITONIN: Procalcitonin: 1.15 ng/mL

## 2019-03-13 NOTE — Progress Notes (Signed)
PROGRESS NOTE  Cole Gill QMV:[id number] DOB: 03/15/1966   PCP: System, Pcp Not In  Patient is from: Home  DOA: 03/12/2019 LOS: 1  Brief Narrative / Interim history: 53 year old male with history of HTN, OSA, left hip replacement by Dr. Charlann Boxerlin on 01/26/2019 complicated by MRSA infection status post I&D with head ball and liner exchange on 02/23/2019 presenting with fever, hypotension and rash.  Since IV vancomycin was increased to 1500 mg twice daily on 03/04/2019.  Then he developed fever, headache, nausea, joint pain and skin rash that prompted him to come to ED 03/11/2019.[name].  On admission, hemodynamically stable.  Creatinine 1.82.  AST 272.  ALT 195.  ALP 243.  Lactic acid 1.6.  WBC 6.1.  Hgb 12.5.  Negative.  Blood cultures obtained.  Left hip x-ray revealed stable hip replacement without acute osseous finding.  ID and orthopedic surgery, Dr. Charlann Boxerlin consulted.  Initially started on Linezolid but transitioned to daptomycin by ID.  Subjective: Had fever to 103 last night.  Pain fairly controlled.  Skin rash improving.  Renal function and LFT downtrending.  Denies chest pain, dyspnea, GI or GU symptoms.  Objective: Vitals:   03/13/19 0900 03/13/19 0920 03/13/19 0941 03/13/19 1000  BP:      Pulse: 83 91 89 86  Resp: 18 (!) 21 11 18   Temp: (!) 100.4 F (38 C)     TempSrc: Oral     SpO2: 100% 99% 95% 94%  Weight:      Height:        Intake/Output Summary (Last 24 hours) at 03/13/2019 1109 Last data filed at 03/13/2019 1000 Gross per 24 hour  Intake 3357.01 ml  Output 3200 ml  Net 157.01 ml   Filed Weights   03/12/19 1200  Weight: 103.2 kg    Examination:  GENERAL: No acute distress.  Appears well.  HEENT: MMM.  Vision and hearing grossly intact.  NECK: Supple.  No apparent JVD.  RESP:  No IWOB. Good air movement bilaterally. CVS:  RRR. Heart sounds normal.  ABD/GI/GU: Bowel sounds present. Soft. Non tender.  MSK/EXT:  Moves extremities. No apparent deformity or edema.   SKIN: Generalized maculopapular rash.  Surgical wound appears clean and dry. NEURO: Awake, alert and oriented appropriately.  No gross deficit.  PSYCH: Calm. Normal affect.   I have personally reviewed the following labs and images:  Radiology Studies: No results found.  Microbiology: Recent Results (from the past 240 hour(s))  Blood culture (routine x 2)     Status: None (Preliminary result)   Collection Time: 03/12/19  5:15 AM   Specimen: BLOOD  Result Value Ref Range Status   Specimen Description   Final    BLOOD RIGHT ANTECUBITAL Performed at [facility], [address] [place], [place] [address]    Special Requests   Final    BOTTLES DRAWN AEROBIC AND ANAEROBIC Blood Culture results may not be optimal due to an excessive volume of blood received in culture bottles Performed at [facility], [address] [place], [place] [address]    Culture   Final    NO GROWTH < 24 HOURS Performed at [facility], [address] [place], [place] [address]    Report Status PENDING  Incomplete  Blood culture (routine x 2)     Status: None (Preliminary result)   Collection Time: 03/12/19  5:29 AM   Specimen: BLOOD  Result Value Ref Range Status   Specimen Description  Final    BLOOD RIGHT ANTECUBITAL Performed at [facility], [address] [place], [place] [address]    Special Requests   Final    BOTTLES DRAWN AEROBIC AND ANAEROBIC Blood Culture adequate volume Performed at [facility], [address] [place], [place] [address]    Culture   Final    NO GROWTH < 24 HOURS Performed at [facility], [address] [place], [place] [address]    Report Status PENDING  Incomplete  SARS Coronavirus 2 (CEPHEID - Performed in [facility] hospital lab), Hosp Order     Status: None   Collection Time: 03/12/19  6:03 AM   Specimen: Nasopharyngeal Swab  Result Value Ref Range Status   SARS  Coronavirus 2 NEGATIVE NEGATIVE Final    Comment: (NOTE) If result is NEGATIVE SARS-CoV-2 target nucleic acids are NOT DETECTED. The SARS-CoV-2 RNA is generally detectable in upper and lower  respiratory specimens during the acute phase of infection. The lowest  concentration of SARS-CoV-2 viral copies this assay can detect is 250  copies / mL. A negative result does not preclude SARS-CoV-2 infection  and should not be used as the sole basis for treatment or other  patient management decisions.  A negative result may occur with  improper specimen collection / handling, submission of specimen other  than nasopharyngeal swab, presence of viral mutation(s) within the  areas targeted by this assay, and inadequate number of viral copies  (<250 copies / mL). A negative result must be combined with clinical  observations, patient history, and epidemiological information. If result is POSITIVE SARS-CoV-2 target nucleic acids are DETECTED. The SARS-CoV-2 RNA is generally detectable in upper and lower  respiratory specimens dur ing the acute phase of infection.  Positive  results are indicative of active infection with SARS-CoV-2.  Clinical  correlation with patient history and other diagnostic information is  necessary to determine patient infection status.  Positive results do  not rule out bacterial infection or co-infection with other viruses. If result is PRESUMPTIVE POSTIVE SARS-CoV-2 nucleic acids MAY BE PRESENT.   A presumptive positive result was obtained on the submitted specimen  and confirmed on repeat testing.  While 2019 novel coronavirus  (SARS-CoV-2) nucleic acids may be present in the submitted sample  additional confirmatory testing may be necessary for epidemiological  and / or clinical management purposes  to differentiate between  SARS-CoV-2 and other Sarbecovirus currently known to infect humans.  If clinically indicated additional testing with an alternate test   methodology [phone]([id number]) is advised. The SARS-CoV-2 RNA is generally  detectable in upper and lower respiratory sp ecimens during the acute  phase of infection. The expected result is Negative. Fact Sheet for Patients:  BoilerBrush.com.cyhttps://www.fda.gov/media/136312/download Fact Sheet for Healthcare Providers: https://pope.com/https://www.fda.gov/media/136313/download This test is not yet approved or cleared by the [place] FDA and has been authorized for detection and/or diagnosis of SARS-CoV-2 by FDA under an Emergency Use Authorization (EUA).  This EUA will remain in effect (meaning this test can be used) for the duration of the COVID-19 declaration under Section 564(b)(1) of the Act, 21 U.S.C. section 360bbb-3(b)(1), unless the authorization is terminated or revoked sooner. Performed at [facility], [address] [place], [place] [address]   Urine culture     Status: None   Collection Time: 03/12/19  7:42 AM   Specimen: Urine, Clean Catch  Result Value Ref Range Status   Specimen Description   Final  URINE, CLEAN CATCH Performed at [facility], [address] [place], [place] [address]    Special Requests   Final    NONE Performed at [facility], [address] [place], [place] [address]    Culture   Final    NO GROWTH Performed at [facility], [address] [place], [place] [address]    Report Status 03/13/2019 FINAL  Final  MRSA PCR Screening     Status: None   Collection Time: 03/12/19 12:09 PM   Specimen: Nasal Mucosa; Nasopharyngeal  Result Value Ref Range Status   MRSA by PCR NEGATIVE NEGATIVE Final    Comment:        The GeneXpert MRSA Assay (FDA approved for NASAL specimens only), is one component of a comprehensive MRSA colonization surveillance program. It is not intended to diagnose MRSA infection nor to guide or monitor treatment for MRSA infections. Performed at [facility], [address] [place], [place] [address]     Sepsis Labs: Invalid input(s): PROCALCITONIN, LACTICIDVEN  Urine analysis:    Component Value Date/Time   COLORURINE AMBER (A) 03/12/2019 0742   APPEARANCEUR HAZY (A) 03/12/2019 0742   LABSPEC 1.017 03/12/2019 0742   PHURINE 5.0 03/12/2019 0742   GLUCOSEU NEGATIVE 03/12/2019 0742   HGBUR NEGATIVE 03/12/2019 0742   BILIRUBINUR NEGATIVE 03/12/2019 0742   KETONESUR NEGATIVE 03/12/2019 0742   PROTEINUR 30 (A) 03/12/2019 0742   NITRITE NEGATIVE 03/12/2019 0742   LEUKOCYTESUR NEGATIVE 03/12/2019 0742    Anemia Panel: No results for input(s): VITAMINB12, FOLATE, FERRITIN, TIBC, IRON, RETICCTPCT in the last 72 hours.  Thyroid Function Tests: No results for input(s): TSH, T4TOTAL, FREET4, T3FREE, THYROIDAB in the last 72 hours.  Lipid Profile: No results for input(s): CHOL, HDL, LDLCALC, TRIG, CHOLHDL, LDLDIRECT in the last 72 hours.  CBG: No results for input(s): GLUCAP in the last 168 hours.  HbA1C: No results for input(s): HGBA1C in the last 72 hours.  BNP (last 3 results): No results for input(s): PROBNP in the last 8760 hours.  Cardiac Enzymes: Recent Labs  Lab 03/12/19 1617  CKTOTAL 36*    Coagulation Profile: No results for input(s): INR, PROTIME in the last 168 hours.  Liver Function Tests: Recent Labs  Lab 03/12/19 0515 03/13/19 0239  AST 272* 120*  ALT 195* 130*  ALKPHOS 243* 194*  BILITOT 2.7* 1.6*  PROT 7.6 5.5*  ALBUMIN 3.8 2.8*   No results for input(s): LIPASE, AMYLASE in the last 168 hours. No results for input(s): AMMONIA in the last 168 hours.  Basic Metabolic Panel: Recent Labs  Lab 03/12/19 0515 03/13/19 0239  NA 132* 130*  K 3.8 3.7  CL 95* 97*  CO2 26 24  GLUCOSE 122* 104*  BUN 25* 17  CREATININE 1.82* 1.28*  CALCIUM 8.7* 7.7*   GFR: Estimated Creatinine Clearance: 78.5 mL/min (A) (by C-G formula based on SCr of 1.28 mg/dL (H)).  CBC: Recent Labs  Lab 03/12/19 0515 03/13/19 0239   WBC 6.1 4.3  NEUTROABS 5.3  --   HGB 12.5* 10.4*  HCT 37.7* 31.8*  MCV 94.5 94.9  PLT 239 179    Procedures:  None  Microbiology summarized: COVID-19 negative. Blood cultures negative so far Urine culture negative so far  Assessment & Plan: Left hip prosthetic joint infection:  -Left hip replacement on 01/26/2019 -I&D on 02/23/2019.  Tissue culture grew MRSA. -Left hip x-ray on 7/30 showed stable left hip replacement.acute finding -Blood  and urine cultures negative so far. -Appreciate ID and orthopedic surgery input -Vancomycin and rifampin stopped out of concern for drug reaction. -Continue daptomycin  Fever: thought to be drug reaction, possibly Vanco and rifampin.  No leukocytosis. -Continue Benadryl-will when once fever and skin rash resolves. -Antibiotic adjustment as above. -Appreciate ID input -PICC line removed.  Generalized maculopapular rash: Improved.  Thought to be drug reaction from Vanco and rifampin.  No cardiopulmonary or GI symptoms to suggest anaphylaxis. -Vancomycin and rifampin stopped. -Continue daptomycin -Continue Benadryl  AKI: Improved.  Suspect ATN from vancomycin, Celebrex, valsartan and HCTZ. AIN is a possibility in the setting of drug reaction. -Stop those potential culprits -Continue monitoring -Continue IV fluids  Elevated liver enzymes: Improved.  Drug reaction versus sepsis.  Tylenol level 13.  Acute hepatitis panel and RUQ  [place] normal. -Continue monitoring  Hyponatremia: Valsartan and HCTZ could contribute.  Stable. -Continue trending  Normocytic anemia: dilutional pattern with all cell lines dropping.  There could be some element of hemoconcentration to begin with. -Continue monitoring  DVT prophylaxis: Subcu Lovenox Code Status: Full code Family Communication: Patient and/or RN. Available if any question.  Disposition Plan: Can transfer to telemetry Consultants: Infectious disease, orthopedic surgery   Antimicrobials:  Anti-infectives (From admission, onward)   Start     Dose/Rate Route Frequency Ordered Stop   03/12/19 2200  DAPTOmycin (CUBICIN) 619 mg in sodium chloride 0.9 % IVPB     6 mg/kg  103.2 kg 224.8 mL/hr over 30 Minutes Intravenous Every 24 hours 03/12/19 1605     03/12/19 0615  piperacillin-tazobactam (ZOSYN) IVPB 3.375 g     3.375 g 100 mL/hr over 30 Minutes Intravenous  Once 03/12/19 0608 03/12/19 1324   03/12/19 0545  linezolid (ZYVOX) IVPB 600 mg     600 mg 300 mL/hr over 60 Minutes Intravenous  Once 03/12/19 0542 03/12/19 0659      Sch Meds:  Scheduled Meds: . aspirin  81 mg Oral Daily  . Chlorhexidine Gluconate Cloth  6 each Topical Q0600  . diphenhydrAMINE  50 mg Oral TID  . enoxaparin (LOVENOX) injection  40 mg Subcutaneous Q24H  . magnesium oxide  800 mg Oral QHS  . tamsulosin  0.8 mg Oral Daily  . Vilazodone HCl  20 mg Oral Daily   Continuous Infusions: . sodium chloride 100 mL/hr at 03/12/19 1652  . DAPTOmycin (CUBICIN)  IV Stopped (03/12/19 2242)  . famotidine (PEPCID) IV Stopped (03/13/19 0941)   PRN Meds:.acetaminophen, albuterol, diphenhydrAMINE, fluticasone, HYDROmorphone, methocarbamol, ondansetron **OR** ondansetron (ZOFRAN) IV, promethazine  Kahmya [address]ham T[address] [place]  If 7PM-7AM, please contact night-coverage [email or web address] Password [email or web address] 03/13/2019, 11:09 AM

## 2019-03-13 NOTE — Plan of Care (Signed)
Pt. To floor from ICU transfer, V/SS, medicated for headache pain x1 with improvement

## 2019-03-13 NOTE — Progress Notes (Signed)
INFECTIOUS DISEASE PROGRESS NOTE  ID: Cole Gill is a 53 y.o. male with  Active Problems:   Drug reaction   Essential hypertension   AKI (acute kidney injury) ([facility])   Abnormal transaminases   Prosthetic hip infection (HCC)   Fever  Subjective: Feels like rash has lessened  Abtx:  Anti-infectives (From admission, onward)   Start     Dose/Rate Route Frequency Ordered Stop   03/12/19 2200  DAPTOmycin (CUBICIN) 619 mg in sodium chloride 0.9 % IVPB     6 mg/kg  103.2 kg 224.8 mL/hr over 30 Minutes Intravenous Every 24 hours 03/12/19 1605     03/12/19 0615  piperacillin-tazobactam (ZOSYN) IVPB 3.375 g     3.375 g 100 mL/hr over 30 Minutes Intravenous  Once 03/12/19 0608 03/12/19 1324   03/12/19 0545  linezolid (ZYVOX) IVPB 600 mg     600 mg 300 mL/hr over 60 Minutes Intravenous  Once 03/12/19 0542 03/12/19 0659      Medications:  Scheduled: . aspirin  81 mg Oral Daily  . Chlorhexidine Gluconate Cloth  6 each Topical Q0600  . diphenhydrAMINE  50 mg Oral TID  . enoxaparin (LOVENOX) injection  40 mg Subcutaneous Q24H  . magnesium oxide  800 mg Oral QHS  . tamsulosin  0.8 mg Oral Daily  . Vilazodone HCl  20 mg Oral Daily    Objective: Vital signs in last 24 hours: Temp:  [98.3 F (36.8 C)-103.1 F (39.5 C)] 98.5 F (36.9 C) (08/01 1541) Pulse Rate:  [68-95] 77 (08/01 1600) Resp:  [11-23] 15 (08/01 1600) BP: (103-163)/(58-73) 128/59 (08/01 1600) SpO2:  [94 %-100 %] 97 % (08/01 1600)   General appearance: alert, cooperative and no distress Resp: clear to auscultation bilaterally Cardio: regular rate and rhythm GI: normal findings: bowel sounds normal and soft, non-tender Extremities: L hip wound clean, no erythema but indurated.  Skin: erythema - generalized  Lab Results Recent Labs    03/12/19 0515 03/13/19 0239  WBC 6.1 4.3  HGB 12.5* 10.4*  HCT 37.7* 31.8*  NA 132* 130*  K 3.8 3.7  CL 95* 97*  CO2 26 24  BUN 25* 17  CREATININE 1.82* 1.28*    Liver Panel Recent Labs    03/12/19 0515 03/13/19 0239  PROT 7.6 5.5*  ALBUMIN 3.8 2.8*  AST 272* 120*  ALT 195* 130*  ALKPHOS 243* 194*  BILITOT 2.7* 1.6*   Sedimentation Rate No results for input(s): ESRSEDRATE in the last 72 hours. C-Reactive Protein Recent Labs    03/12/19 0742  CRP 17.3*    Microbiology: Recent Results (from the past 240 hour(s))  Blood culture (routine x 2)     Status: None (Preliminary result)   Collection Time: 03/12/19  5:15 AM   Specimen: BLOOD  Result Value Ref Range Status   Specimen Description   Final    BLOOD RIGHT ANTECUBITAL Performed at [facility] [address], [place], [place] [address]    Special Requests   Final    BOTTLES DRAWN AEROBIC AND ANAEROBIC Blood Culture results may not be optimal due to an excessive volume of blood received in culture bottles Performed at [facility] [address] [place], [place] [address]    Culture   Final    NO GROWTH < 24 HOURS Performed at [facility] [address] [place], [place] [address]    Report Status PENDING  Incomplete  Blood culture (routine x 2)  Status: None (Preliminary result)   Collection Time: 03/12/19  5:29 AM   Specimen: BLOOD  Result Value Ref Range Status   Specimen Description   Final    BLOOD RIGHT ANTECUBITAL Performed at [facility], [address] [place], [place] [address]    Special Requests   Final    BOTTLES DRAWN AEROBIC AND ANAEROBIC Blood Culture adequate volume Performed at [facility], [address] [place], [place] [address]    Culture   Final    NO GROWTH < 24 HOURS Performed at [facility], [address] [place], [place] [address]    Report Status PENDING  Incomplete  SARS Coronavirus 2 (CEPHEID - Performed in [facility] hospital lab), Hosp Order     Status: None   Collection Time: 03/12/19  6:03 AM   Specimen: Nasopharyngeal Swab  Result  Value Ref Range Status   SARS Coronavirus 2 NEGATIVE NEGATIVE Final    Comment: (NOTE) If result is NEGATIVE SARS-CoV-2 target nucleic acids are NOT DETECTED. The SARS-CoV-2 RNA is generally detectable in upper and lower  respiratory specimens during the acute phase of infection. The lowest  concentration of SARS-CoV-2 viral copies this assay can detect is 250  copies / mL. A negative result does not preclude SARS-CoV-2 infection  and should not be used as the sole basis for treatment or other  patient management decisions.  A negative result may occur with  improper specimen collection / handling, submission of specimen other  than nasopharyngeal swab, presence of viral mutation(s) within the  areas targeted by this assay, and inadequate number of viral copies  (<250 copies / mL). A negative result must be combined with clinical  observations, patient history, and epidemiological information. If result is POSITIVE SARS-CoV-2 target nucleic acids are DETECTED. The SARS-CoV-2 RNA is generally detectable in upper and lower  respiratory specimens dur ing the acute phase of infection.  Positive  results are indicative of active infection with SARS-CoV-2.  Clinical  correlation with patient history and other diagnostic information is  necessary to determine patient infection status.  Positive results do  not rule out bacterial infection or co-infection with other viruses. If result is PRESUMPTIVE POSTIVE SARS-CoV-2 nucleic acids MAY BE PRESENT.   A presumptive positive result was obtained on the submitted specimen  and confirmed on repeat testing.  While 2019 novel coronavirus  (SARS-CoV-2) nucleic acids may be present in the submitted sample  additional confirmatory testing may be necessary for epidemiological  and / or clinical management purposes  to differentiate between  SARS-CoV-2 and other Sarbecovirus currently known to infect humans.  If clinically indicated additional testing  with an alternate test  methodology [phone]([id number]) is advised. The SARS-CoV-2 RNA is generally  detectable in upper and lower respiratory sp ecimens during the acute  phase of infection. The expected result is Negative. Fact Sheet for Patients:  BoilerBrush.com.cyhttps://www.fda.gov/media/136312/download Fact Sheet for Healthcare Providers: https://pope.com/https://www.fda.gov/media/136313/download This test is not yet approved or cleared by the [place] FDA and has been authorized for detection and/or diagnosis of SARS-CoV-2 by FDA under an Emergency Use Authorization (EUA).  This EUA will remain in effect (meaning this test can be used) for the duration of the COVID-19 declaration under Section 564(b)(1) of the Act, 21 U.S.C. section 360bbb-3(b)(1), unless the authorization is terminated or revoked sooner. Performed at [facility], [address] [place], [place] [address]   Urine culture     Status: None  Collection Time: 03/12/19  7:42 AM   Specimen: Urine, Clean Catch  Result Value Ref Range Status   Specimen Description   Final    URINE, CLEAN CATCH Performed at [facility], [address] [place], [place] [address]    Special Requests   Final    NONE Performed at [facility], [address] [place], [place] [address]    Culture   Final    NO GROWTH Performed at [facility], [address] [place], [place] [address]    Report Status 03/13/2019 FINAL  Final  MRSA PCR Screening     Status: None   Collection Time: 03/12/19 12:09 PM   Specimen: Nasal Mucosa; Nasopharyngeal  Result Value Ref Range Status   MRSA by PCR NEGATIVE NEGATIVE Final    Comment:        The GeneXpert MRSA Assay (FDA approved for NASAL specimens only), is one component of a comprehensive MRSA colonization surveillance program. It is not intended to diagnose MRSA infection nor to guide or monitor treatment for MRSA infections. Performed at [facility], [address] [place], [place] [address]     Studies/Results: Dg Chest Portable 1 View  Result Date: 03/12/2019 CLINICAL DATA:  Fever. EXAM: PORTABLE CHEST 1 VIEW COMPARISON:  None. FINDINGS: UPPER limits normal heart size noted. Mild pulmonary vascular congestion is noted. A LEFT central venous catheter is noted with tip overlying the LOWER SVC. There is no evidence of focal airspace disease, pulmonary edema, suspicious pulmonary nodule/mass, pleural effusion, or pneumothorax. Probable degenerative changes within both shoulders noted. IMPRESSION: UPPER limits normal heart size and mild pulmonary vascular congestion. Electronically Signed   By: Harmon PierJeffrey  Hu M.D.   On: 03/12/2019 08:39   Dg Hip Port Unilat W Or Wo Pelvis 1 View Left  Result Date: 03/12/2019 CLINICAL DATA:  Left hip replacement. Postop infection. Increase swelling and redness. EXAM: DG HIP (WITH OR WITHOUT PELVIS) 1V PORT LEFT COMPARISON:  No recent prior. FINDINGS: Total left hip replacement. Hardware intact. Anatomic alignment. Stable tiny bony density noted adjacent to the lower portion of the acetabulum. No acute bony abnormality. No erosive changes. IMPRESSION: Total left hip replacement with stable appearance. No acute bony abnormality identified. No bony erosions noted. Electronically Signed   By: Maisie Fushomas  Register   On: 03/12/2019 06:07   [place] Abdomen Limited Ruq  Result Date: 03/12/2019 CLINICAL DATA:  Elevated liver function test EXAM: ULTRASOUND ABDOMEN LIMITED RIGHT UPPER QUADRANT COMPARISON:  None. FINDINGS: Gallbladder: No gallstones or wall thickening visualized. No sonographic Murphy sign noted by sonographer. Common bile duct: Diameter: 3 mm Liver: No focal lesion identified. Within normal limits in parenchymal echogenicity. Portal vein is patent on color Doppler imaging with normal direction of blood flow towards the liver. IMPRESSION: Normal right upper quadrant ultrasound. Electronically Signed    By: Marnee SpringJonathon  Watts M.D.   On: 03/12/2019 07:29     Assessment/Plan: L hip THR infection MRSA ADR, vancomycin  Total days of antibiotics: 18 (dapto 1)  Fever better Cr better WBC and H/H stable Watch CK on dapto Pt states he would rather go home on PO as he discussed with Dr Lorenso [place]ers. Will need ot f/u whit on 8-3.  Available as needed on 8-2         Johny SaxJeffrey Kaeden Depaz MD, FACP Infectious Diseases (pager) [phone] [email or web address] 03/13/2019, 4:15 PM  LOS: 1 day

## 2019-03-13 NOTE — Progress Notes (Signed)
Cole Gill 1222 transferred to [id number] on tele., and with Dianne NT. Report was called to [employer] on 4th floor.

## 2019-03-14 LAB — COMPREHENSIVE METABOLIC PANEL
ALT: 105 U/L — ABNORMAL HIGH (ref 0–44)
AST: 83 U/L — ABNORMAL HIGH (ref 15–41)
Albumin: 2.7 g/dL — ABNORMAL LOW (ref 3.5–5.0)
Alkaline Phosphatase: 185 U/L — ABNORMAL HIGH (ref 38–126)
Anion gap: 10 (ref 5–15)
BUN: 14 mg/dL (ref 6–20)
CO2: 22 mmol/L (ref 22–32)
Calcium: 7.9 mg/dL — ABNORMAL LOW (ref 8.9–10.3)
Chloride: 101 mmol/L (ref 98–111)
Creatinine, Ser: 1.17 mg/dL (ref 0.61–1.24)
GFR calc Af Amer: >60 mL/min (ref >60)
GFR calc non Af Amer: >60 mL/min (ref >60)
Glucose, Bld: 111 mg/dL — ABNORMAL HIGH (ref 70–99)
Potassium: 3.5 mmol/L (ref 3.5–5.1)
Sodium: 133 mmol/L — ABNORMAL LOW (ref 135–145)
Total Bilirubin: 0.9 mg/dL (ref 0.3–1.2)
Total Protein: 5.5 g/dL — ABNORMAL LOW (ref 6.5–8.1)

## 2019-03-14 LAB — PROCALCITONIN: Procalcitonin: 0.59 ng/mL

## 2019-03-14 LAB — CBC
HCT: 31.8 % — ABNORMAL LOW (ref 39.0–52.0)
Hemoglobin: 10.2 g/dL — ABNORMAL LOW (ref 13.0–17.0)
MCH: 30.7 pg (ref 26.0–34.0)
MCHC: 32.1 g/dL (ref 30.0–36.0)
MCV: 95.8 fL (ref 80.0–100.0)
Platelets: 176 10*3/uL (ref 150–400)
RBC: 3.32 MIL/uL — ABNORMAL LOW (ref 4.22–5.81)
RDW: 14 % (ref 11.5–15.5)
WBC: 3.8 10*3/uL — ABNORMAL LOW (ref 4.0–10.5)
nRBC: 0 % (ref 0.0–0.2)

## 2019-03-14 LAB — MAGNESIUM: Magnesium: 1.8 mg/dL (ref 1.7–2.4)

## 2019-03-14 MED ORDER — DIPHENHYDRAMINE HCL 25 MG PO CAPS: 25.0000 mg | ORAL_CAPSULE | Freq: Three times a day (TID) | ORAL | Status: DC

## 2019-03-14 MED ORDER — DIPHENHYDRAMINE HCL 25 MG PO CAPS
25.0000 mg | ORAL_CAPSULE | Freq: Three times a day (TID) | ORAL | Status: DC | PRN
  Administered 2019-03-15: 01:00:00 25 mg via ORAL
  Filled 2019-03-14: qty 1

## 2019-03-14 MED ORDER — METHOCARBAMOL 500 MG PO TABS: 500.0000 mg | ORAL_TABLET | Freq: Three times a day (TID) | ORAL | Status: DC | PRN

## 2019-03-14 MED ORDER — HYDROXYZINE HCL 25 MG PO TABS: 25.0000 mg | ORAL_TABLET | Freq: Three times a day (TID) | ORAL | Status: DC | PRN

## 2019-03-14 MED ORDER — FAMOTIDINE 20 MG PO TABS
20.0000 mg | ORAL_TABLET | Freq: Two times a day (BID) | ORAL | Status: DC
  Administered 2019-03-14 – 2019-03-16 (×4): 20 mg via ORAL
  Filled 2019-03-14 (×4): qty 1

## 2019-03-14 NOTE — Progress Notes (Signed)
PROGRESS NOTE  Cole Gill XBJ:[id number] DOB: 19-Jul-1966   PCP: System, Pcp Not In  Patient is from: Home  DOA: 03/12/2019 LOS: 2  Brief Narrative / Interim history: 53 year old male with history of HTN, OSA, left hip replacement by Dr. Charlann Boxerlin on 01/26/2019 complicated by MRSA infection status post I&D with head ball and liner exchange on 02/23/2019 presenting with fever, hypotension and rash.  Since IV vancomycin was increased to 1500 mg twice daily on 03/04/2019.  Then he developed fever, headache, nausea, joint pain and skin rash that prompted him to come to ED 03/11/2019.[name].  On admission, hemodynamically stable.  Creatinine 1.82.  AST 272.  ALT 195.  ALP 243.  Lactic acid 1.6.  WBC 6.1.  Hgb 12.5.  Negative.  Blood cultures obtained.  Left hip x-ray revealed stable hip replacement without acute osseous finding.  ID and orthopedic surgery, Dr. Charlann Boxerlin consulted.  Initially started on Linezolid but transitioned to daptomycin by ID.  Subjective: No major events overnight of this morning.  Spiked fever to 101.8 last night.  Has no complaint this morning.  He says skin rash has started itching especially around his neck.  He denies chest pain, dyspnea, GI or GU symptoms.  Denies scratchy throat.  Skin rash persisted.  Objective: Vitals:   03/13/19 2150 03/14/19 0535 03/14/19 0604 03/14/19 1209  BP:  140/70  127/70  Pulse:  75  67  Resp:  18  19  Temp: 99.8 F (37.7 C) (!) 101.8 F (38.8 C) 100.1 F (37.8 C) 98.8 F (37.1 C)  TempSrc:  Oral Oral Oral  SpO2:  94%  97%  Weight:      Height:        Intake/Output Summary (Last 24 hours) at 03/14/2019 1243 Last data filed at 03/14/2019 1211 Gross per 24 hour  Intake 720 ml  Output 1878 ml  Net -1158 ml   Filed Weights   03/12/19 1200  Weight: 103.2 kg    Examination:  GENERAL: No acute distress.  Appears well.  HEENT: MMM.  Vision and hearing grossly intact.  NECK: Supple.  No apparent JVD.  RESP:  No IWOB. Good air movement  bilaterally. CVS:  RRR. Heart sounds normal.  ABD/GI/GU: Bowel sounds present. Soft. Non tender.  MSK/EXT:  Moves extremities. No apparent deformity or edema.  SKIN: Generalized maculopapular rash all over.  Surgical wound appears clean and dry. NEURO: Awake, alert and oriented appropriately.  No gross deficit.  PSYCH: Calm. Normal affect.   I have personally reviewed the following labs and images:  Radiology Studies: No results found.  Microbiology: Recent Results (from the past 240 hour(s))  Blood culture (routine x 2)     Status: None (Preliminary result)   Collection Time: 03/12/19  5:15 AM   Specimen: BLOOD  Result Value Ref Range Status   Specimen Description   Final    BLOOD RIGHT ANTECUBITAL Performed at [facility], [address] [place], [place] [address]    Special Requests   Final    BOTTLES DRAWN AEROBIC AND ANAEROBIC Blood Culture results may not be optimal due to an excessive volume of blood received in culture bottles Performed at [facility], [address] [place], [place] [address]    Culture   Final    NO GROWTH 2 DAYS Performed at [facility], [address] [place], [place] [address]    Report Status PENDING  Incomplete  Blood culture (routine x 2)  Status: None (Preliminary result)   Collection Time: 03/12/19  5:29 AM   Specimen: BLOOD  Result Value Ref Range Status   Specimen Description   Final    BLOOD RIGHT ANTECUBITAL Performed at [facility], [address] [place], [place] [address]    Special Requests   Final    BOTTLES DRAWN AEROBIC AND ANAEROBIC Blood Culture adequate volume Performed at [facility], [address] [place], [place] [address]    Culture   Final    NO GROWTH 2 DAYS Performed at [facility], [address] [place], [place] [address]    Report Status PENDING  Incomplete  SARS Coronavirus 2 (CEPHEID - Performed in [facility] lab), Hosp Order     Status: None   Collection Time: 03/12/19  6:03 AM   Specimen: Nasopharyngeal Swab  Result Value Ref Range Status   SARS Coronavirus 2 NEGATIVE NEGATIVE Final    Comment: (NOTE) If result is NEGATIVE SARS-CoV-2 target nucleic acids are NOT DETECTED. The SARS-CoV-2 RNA is generally detectable in upper and lower  respiratory specimens during the acute phase of infection. The lowest  concentration of SARS-CoV-2 viral copies this assay can detect is 250  copies / mL. A negative result does not preclude SARS-CoV-2 infection  and should not be used as the sole basis for treatment or other  patient management decisions.  A negative result may occur with  improper specimen collection / handling, submission of specimen other  than nasopharyngeal swab, presence of viral mutation(s) within the  areas targeted by this assay, and inadequate number of viral copies  (<250 copies / mL). A negative result must be combined with clinical  observations, patient history, and epidemiological information. If result is POSITIVE SARS-CoV-2 target nucleic acids are DETECTED. The SARS-CoV-2 RNA is generally detectable in upper and lower  respiratory specimens dur ing the acute phase of infection.  Positive  results are indicative of active infection with SARS-CoV-2.  Clinical  correlation with patient history and other diagnostic information is  necessary to determine patient infection status.  Positive results do  not rule out bacterial infection or co-infection with other viruses. If result is PRESUMPTIVE POSTIVE SARS-CoV-2 nucleic acids MAY BE PRESENT.   A presumptive positive result was obtained on the submitted specimen  and confirmed on repeat testing.  While 2019 novel coronavirus  (SARS-CoV-2) nucleic acids may be present in the submitted sample  additional confirmatory testing may be necessary for epidemiological  and / or clinical management purposes  to  differentiate between  SARS-CoV-2 and other Sarbecovirus currently known to infect humans.  If clinically indicated additional testing with an alternate test  methodology [phone]([id number]) is advised. The SARS-CoV-2 RNA is generally  detectable in upper and lower respiratory sp ecimens during the acute  phase of infection. The expected result is Negative. Fact Sheet for Patients:  BoilerBrush.com.cyhttps://www.fda.gov/media/136312/download Fact Sheet for Healthcare Providers: https://pope.com/https://www.fda.gov/media/136313/download This test is not yet approved or cleared by the [place] FDA and has been authorized for detection and/or diagnosis of SARS-CoV-2 by FDA under an Emergency Use Authorization (EUA).  This EUA will remain in effect (meaning this test can be used) for the duration of the COVID-19 declaration under Section 564(b)(1) of the Act, 21 U.S.C. section 360bbb-3(b)(1), unless the authorization is terminated or revoked sooner. Performed at [facility], [address] [place], [place] [address]   Urine culture     Status: None  Collection Time: 03/12/19  7:42 AM   Specimen: Urine, Clean Catch  Result Value Ref Range Status   Specimen Description   Final    URINE, CLEAN CATCH Performed at [facility], [address] [place], [place] [address]    Special Requests   Final    NONE Performed at [facility], [address] [place], [place] [address]    Culture   Final    NO GROWTH Performed at [facility], [address] [place], [place] [address]    Report Status 03/13/2019 FINAL  Final  MRSA PCR Screening     Status: None   Collection Time: 03/12/19 12:09 PM   Specimen: Nasal Mucosa; Nasopharyngeal  Result Value Ref Range Status   MRSA by PCR NEGATIVE NEGATIVE Final    Comment:        The GeneXpert MRSA Assay (FDA approved for NASAL specimens only), is one component of a comprehensive MRSA colonization surveillance program.  It is not intended to diagnose MRSA infection nor to guide or monitor treatment for MRSA infections. Performed at [facility], [address] [place], [place] [address]     Sepsis Labs: Invalid input(s): PROCALCITONIN, LACTICIDVEN  Urine analysis:    Component Value Date/Time   COLORURINE AMBER (A) 03/12/2019 0742   APPEARANCEUR HAZY (A) 03/12/2019 0742   LABSPEC 1.017 03/12/2019 0742   PHURINE 5.0 03/12/2019 0742   GLUCOSEU NEGATIVE 03/12/2019 0742   HGBUR NEGATIVE 03/12/2019 0742   BILIRUBINUR NEGATIVE 03/12/2019 0742   KETONESUR NEGATIVE 03/12/2019 0742   PROTEINUR 30 (A) 03/12/2019 0742   NITRITE NEGATIVE 03/12/2019 0742   LEUKOCYTESUR NEGATIVE 03/12/2019 0742    Anemia Panel: No results for input(s): VITAMINB12, FOLATE, FERRITIN, TIBC, IRON, RETICCTPCT in the last 72 hours.  Thyroid Function Tests: No results for input(s): TSH, T4TOTAL, FREET4, T3FREE, THYROIDAB in the last 72 hours.  Lipid Profile: No results for input(s): CHOL, HDL, LDLCALC, TRIG, CHOLHDL, LDLDIRECT in the last 72 hours.  CBG: No results for input(s): GLUCAP in the last 168 hours.  HbA1C: No results for input(s): HGBA1C in the last 72 hours.  BNP (last 3 results): No results for input(s): PROBNP in the last 8760 hours.  Cardiac Enzymes: Recent Labs  Lab 03/12/19 1617  CKTOTAL 36*    Coagulation Profile: No results for input(s): INR, PROTIME in the last 168 hours.  Liver Function Tests: Recent Labs  Lab 03/12/19 0515 03/13/19 0239 03/14/19 0625  AST 272* 120* 83*  ALT 195* 130* 105*  ALKPHOS 243* 194* 185*  BILITOT 2.7* 1.6* 0.9  PROT 7.6 5.5* 5.5*  ALBUMIN 3.8 2.8* 2.7*   No results for input(s): LIPASE, AMYLASE in the last 168 hours. No results for input(s): AMMONIA in the last 168 hours.  Basic Metabolic Panel: Recent Labs  Lab 03/12/19 0515 03/13/19 0239 03/14/19 0625  NA 132* 130* 133*  K 3.8 3.7 3.5  CL 95* 97* 101  CO2 26 24 22    GLUCOSE 122* 104* 111*  BUN 25* 17 14  CREATININE 1.82* 1.28* 1.17  CALCIUM 8.7* 7.7* 7.9*  MG  --   --  1.8   GFR: Estimated Creatinine Clearance: 85.9 mL/min (by C-G formula based on SCr of 1.17 mg/dL).  CBC: Recent Labs  Lab 03/12/19 0515 03/13/19 0239 03/14/19 0625  WBC 6.1 4.3 3.8*  NEUTROABS 5.3  --   --   HGB 12.5* 10.4* 10.2*  HCT 37.7* 31.8* 31.8*  MCV 94.5  94.9 95.8  PLT 239 179 176    Procedures:  None  Microbiology summarized: WIOMB-55 negative. Blood cultures negative so far Urine culture negative so far  Assessment & Plan: Left hip prosthetic joint infection: Stable. -Left hip replacement on 01/26/2019 -I&D on 02/23/2019.  Tissue culture grew MRSA. -Left hip x-ray on 7/30 showed stable left hip s/p replacement but no acute osseous finding -Blood and urine cultures negative so far. -Appreciate ID and orthopedic surgery input -Vancomycin and rifampin stopped out of concern for drug reaction. -Continue daptomycin per ID. -PT/OT  Fever/generalized maculopapular rash: thought to be drug reaction, possibly from Vanco and rifampin.  No leukocytosis.  Fever curve downtrending.  No cardiopulmonary or GI symptoms to suggest anaphylaxis. -Change Benadryl to 25 mg 3 times daily as needed itching -Add Atarax for pruritus. -Continue Pepcid -Antibiotic adjustment as above. -Appreciate ID input  AKI: Improved.  Suspect ATN from vancomycin, Celebrex, valsartan and HCTZ. AIN is a possibility in the setting of drug reaction. -Stop those potential culprits -Continue monitoring -Continue IV fluids  Elevated liver enzymes: Improved.  Drug reaction versus sepsis.  Tylenol level 13.  Acute hepatitis panel and RUQ  [place] normal.  Improving. -Continue monitoring  Hyponatremia: Valsartan and HCTZ could contribute.  Improving. -Continue trending  Normocytic anemia: dilutional pattern with all cell lines dropping.  There could be some element of hemoconcentration to begin  with. -H&H stable.  Continue monitoring  DVT prophylaxis: Subcu Lovenox Code Status: Full code Family Communication: Patient and/or RN. Available if any question.  Disposition Plan: Remains inpatient on IV daptomycin. Consultants: Infectious disease, orthopedic surgery   Antimicrobials: Anti-infectives (From admission, onward)   Start     Dose/Rate Route Frequency Ordered Stop   03/12/19 2200  DAPTOmycin (CUBICIN) 619 mg in sodium chloride 0.9 % IVPB     6 mg/kg  103.2 kg 224.8 mL/hr over 30 Minutes Intravenous Every 24 hours 03/12/19 1605     03/12/19 0615  piperacillin-tazobactam (ZOSYN) IVPB 3.375 g     3.375 g 100 mL/hr over 30 Minutes Intravenous  Once 03/12/19 0608 03/12/19 1324   03/12/19 0545  linezolid (ZYVOX) IVPB 600 mg     600 mg 300 mL/hr over 60 Minutes Intravenous  Once 03/12/19 0542 03/12/19 0659      Sch Meds:  Scheduled Meds: . aspirin  81 mg Oral Daily  . Chlorhexidine Gluconate Cloth  6 each Topical Q0600  . diphenhydrAMINE  50 mg Oral TID  . enoxaparin (LOVENOX) injection  40 mg Subcutaneous Q24H  . magnesium oxide  800 mg Oral QHS  . tamsulosin  0.8 mg Oral Daily  . Vilazodone HCl  20 mg Oral Daily   Continuous Infusions: . sodium chloride 100 mL/hr at 03/14/19 0119  . DAPTOmycin (CUBICIN)  IV 619 mg (03/13/19 2244)  . famotidine (PEPCID) IV 20 mg (03/14/19 1000)   PRN Meds:.acetaminophen, albuterol, diphenhydrAMINE, fluticasone, HYDROmorphone, methocarbamol, ondansetron **OR** ondansetron (ZOFRAN) IV, promethazine  Keshawna Dix T. [place]  If 7PM-7AM, please contact night-coverage [email or web address] Password [facility] 03/14/2019, 12:43 PM

## 2019-03-14 NOTE — Evaluation (Signed)
Physical Therapy Evaluation Patient Details Name: Cole Gill MRN: [id number] DOB: 04-18-1966 Today's Date: 03/14/2019   History of Present Illness  53 yo male admitted with fevers,drug reaction. Hx of L THA 01/2019, L hip I&D/poly exchange 02/2019, MRSA, sleep apnea  Clinical Impression  On eval, pt was Supv-Min guard assist for mobility. He walked ~250 feet in the hallway. Mild intermittent unsteadiness noted. Pt tolerated activity well. Discussed d/c plan-pt plans to return home. He politely declines PT f/u after discharge.     Follow Up Recommendations Supervision for mobility/OOB;No PT follow up(pt politely declines any PT f/u)    Equipment Recommendations  None recommended by PT    Recommendations for Other Services       Precautions / Restrictions Precautions Precautions: Fall Restrictions Weight Bearing Restrictions: No      Mobility  Bed Mobility Overal bed mobility: Modified Independent         Sit to supine: Modified independent (Device/Increase time)      Transfers Overall transfer level: Modified independent                  Ambulation/Gait Ambulation/Gait assistance: Supervision; Min guard  Gait Distance (Feet): 250 Feet Assistive device: None Gait Pattern/deviations: Step-through pattern;Drifts right/left     General Gait Details: Mild intermittent unsteadinesss. No overt LOB. Pt tolerated distance well. No c/o dizziness/lightheadedness  Stairs            Wheelchair Mobility    Modified Rankin (Stroke Patients Only)       Balance Overall balance assessment: Mild deficits observed, not formally tested                                           Pertinent Vitals/Pain Pain Assessment: No/denies pain    Home Living Family/patient expects to be discharged to:: Private residence Living Arrangements: Spouse/significant other Available Help at Discharge: Family Type of Home: House Home Access: Stairs to  enter Entrance Stairs-Rails: Right Entrance Stairs-Number of Steps: 6 Home Layout: Bed/bath upstairs;Two level Home Equipment: Walker - 2 wheels;Bedside commode      Prior Function Level of Independence: Independent               Hand Dominance        Extremity/Trunk Assessment   Upper Extremity Assessment Upper Extremity Assessment: Defer to OT evaluation    Lower Extremity Assessment Lower Extremity Assessment: Generalized weakness    Cervical / Trunk Assessment Cervical / Trunk Assessment: Normal  Communication   Communication: No difficulties  Cognition Arousal/Alertness: Awake/alert Behavior During Therapy: WFL for tasks assessed/performed Overall Cognitive Status: Within Functional Limits for tasks assessed                                        General Comments      Exercises     Assessment/Plan    PT Assessment Patient needs continued PT services  PT Problem List Decreased balance       PT Treatment Interventions Gait training;Functional mobility training;Balance training;Patient/family education;Therapeutic exercise;Therapeutic activities    PT Goals (Current goals can be found in the Care Plan section)  Acute Rehab PT Goals Patient Stated Goal: home. regain PLOF PT Goal Formulation: With patient Time For Goal Achievement: 03/28/19 Potential to Achieve Goals: Good    Frequency  Min 2X/week   Barriers to discharge        Co-evaluation               AM-PAC PT "6 Clicks" Mobility  Outcome Measure Help needed turning from your back to your side while in a flat bed without using bedrails?: None Help needed moving from lying on your back to sitting on the side of a flat bed without using bedrails?: None Help needed moving to and from a bed to a chair (including a wheelchair)?: None Help needed standing up from a chair using your arms (e.g., wheelchair or bedside chair)?: None Help needed to walk in hospital room?: A  Little Help needed climbing 3-5 steps with a railing? : A Little 6 Click Score: 22    End of Session   Activity Tolerance: Patient tolerated treatment well Patient left: in chair;with call bell/phone within reach   PT Visit Diagnosis: Unsteadiness on feet (R26.81)    Time: 1436-1450 PT Time Calculation (min) (ACUTE ONLY): 14 min   Charges:   PT Evaluation $PT Eval Moderate Complexity: 1 Mod            Rebeca AlertJannie Jazmin Ley, PT Acute Rehabilitation Services Pager: [phone] Office: [phone]

## 2019-03-14 NOTE — Evaluation (Signed)
Occupational Therapy Evaluation Patient Details Name: Cole Gill MRN: [id number] DOB: 1965-12-03 Today's Date: 03/14/2019    History of Present Illness 53 yo male admitted with fevers,drug reaction. Hx of L THA 01/2019, L hip I&D/poly exchange 02/2019, MRSA, sleep apnea   Clinical Impression   Pt was at home mod I with AE for LB dressing. Pt is at that baseline. Able to demonstrate toilet transfer, peri care, sink level grooming, and verbalize use of AE. Education complete, OT to sign off at this time.     Follow Up Recommendations  No OT follow up    Equipment Recommendations  None recommended by OT    Recommendations for Other Services       Precautions / Restrictions Precautions Precautions: Fall Restrictions Weight Bearing Restrictions: No      Mobility Bed Mobility Overal bed mobility: Modified Independent         Sit to supine: Modified independent (Device/Increase time)   General bed mobility comments: OOB in recliner at beginning and end of session  Transfers Overall transfer level: Modified independent                    Balance Overall balance assessment: Mild deficits observed, not formally tested                                         ADL either performed or assessed with clinical judgement   ADL Overall ADL's : At baseline                                       General ADL Comments: able to perform toilet transfer, peri care, sink level grooming, and states that he has AE at home for LB dressing/bathing. Does not need shower chair (although was able to explain how to use 3 in 1 for shower chair)      Vision Patient Visual Report: No change from baseline       Perception     Praxis      Pertinent Vitals/Pain Pain Assessment: No/denies pain     Hand Dominance     Extremity/Trunk Assessment Upper Extremity Assessment Upper Extremity Assessment: Overall WFL for tasks assessed   Lower Extremity  Assessment Lower Extremity Assessment: Defer to PT evaluation   Cervical / Trunk Assessment Cervical / Trunk Assessment: Normal   Communication Communication Communication: No difficulties   Cognition Arousal/Alertness: Awake/alert Behavior During Therapy: WFL for tasks assessed/performed Overall Cognitive Status: Within Functional Limits for tasks assessed                                     General Comments       Exercises     Shoulder Instructions      Home Living Family/patient expects to be discharged to:: Private residence Living Arrangements: Spouse/significant other Available Help at Discharge: Family;Available 24 hours/day Type of Home: House Home Access: Stairs to enter [employer] of Steps: 6 Entrance Stairs-Rails: Right Home Layout: Bed/bath upstairs;Two level Alternate Level Stairs-Number of Steps: flight Alternate Level Stairs-Rails: Right Bathroom Shower/Tub: Teacher, early years/pre: Standard     Home Equipment: Environmental consultant - 2 wheels;Bedside commode;Adaptive equipment Adaptive Equipment: Reacher;Sock aid;Long-handled shoe horn;Long-handled sponge  Prior Functioning/Environment Level of Independence: Independent with assistive device(s)(uses AE for LB dressing/bathing)                 OT Problem List: Decreased range of motion;Decreased knowledge of use of DME or AE      OT Treatment/Interventions:      OT Goals(Current goals can be found in the care plan section) Acute Rehab OT Goals Patient Stated Goal: home. regain PLOF OT Goal Formulation: With patient Time For Goal Achievement: 03/28/19 Potential to Achieve Goals: Good  OT Frequency:     Barriers to D/C:            Co-evaluation              AM-PAC OT "6 Clicks" Daily Activity     Outcome Measure Help from another person eating meals?: None Help from another person taking care of personal grooming?: None Help from another person  toileting, which includes using toliet, bedpan, or urinal?: None Help from another person bathing (including washing, rinsing, drying)?: None Help from another person to put on and taking off regular upper body clothing?: None Help from another person to put on and taking off regular lower body clothing?: A Little 6 Click Score: 23   End of Session Nurse Communication: Mobility status  Activity Tolerance: Patient tolerated treatment well Patient left: in chair;with call bell/phone within reach  OT Visit Diagnosis: Other abnormalities of gait and mobility (R26.89)                Time: [id number] OT Time Calculation (min): 17 min Charges:  OT General Charges $OT Visit: 1 Visit OT Evaluation $OT Eval Low Complexity: 1 Low  [name] OTR/L Acute Rehabilitation Services Pager: [phone] Office: [phone]  [name] 03/14/2019, 4:03 PM

## 2019-03-14 NOTE — Progress Notes (Signed)
Patient stated that he did not want to wear cpap.Encouraged patient to call if he changes his mind.

## 2019-03-15 ENCOUNTER — Inpatient Hospital Stay: Payer: Self-pay

## 2019-03-15 DIAGNOSIS — R740 Nonspecific elevation of levels of transaminase and lactic acid dehydrogenase [LDH]: Secondary | ICD-10-CM

## 2019-03-15 DIAGNOSIS — Z96651 Presence of right artificial knee joint: Secondary | ICD-10-CM

## 2019-03-15 DIAGNOSIS — T8452XD Infection and inflammatory reaction due to internal left hip prosthesis, subsequent encounter: Secondary | ICD-10-CM

## 2019-03-15 DIAGNOSIS — D72829 Elevated white blood cell count, unspecified: Secondary | ICD-10-CM

## 2019-03-15 LAB — COMPREHENSIVE METABOLIC PANEL
ALT: 115 U/L — ABNORMAL HIGH (ref 0–44)
AST: 124 U/L — ABNORMAL HIGH (ref 15–41)
Albumin: 2.6 g/dL — ABNORMAL LOW (ref 3.5–5.0)
Alkaline Phosphatase: 178 U/L — ABNORMAL HIGH (ref 38–126)
Anion gap: 7 (ref 5–15)
BUN: 12 mg/dL (ref 6–20)
CO2: 24 mmol/L (ref 22–32)
Calcium: 8.2 mg/dL — ABNORMAL LOW (ref 8.9–10.3)
Chloride: 105 mmol/L (ref 98–111)
Creatinine, Ser: 1.04 mg/dL (ref 0.61–1.24)
GFR calc Af Amer: >60 mL/min (ref >60)
GFR calc non Af Amer: >60 mL/min (ref >60)
Glucose, Bld: 97 mg/dL (ref 70–99)
Potassium: 3.9 mmol/L (ref 3.5–5.1)
Sodium: 136 mmol/L (ref 135–145)
Total Bilirubin: 0.7 mg/dL (ref 0.3–1.2)
Total Protein: 5.4 g/dL — ABNORMAL LOW (ref 6.5–8.1)

## 2019-03-15 LAB — CBC
HCT: 30 % — ABNORMAL LOW (ref 39.0–52.0)
Hemoglobin: 9.5 g/dL — ABNORMAL LOW (ref 13.0–17.0)
MCH: 30.3 pg (ref 26.0–34.0)
MCHC: 31.7 g/dL (ref 30.0–36.0)
MCV: 95.5 fL (ref 80.0–100.0)
Platelets: 171 10*3/uL (ref 150–400)
RBC: 3.14 MIL/uL — ABNORMAL LOW (ref 4.22–5.81)
RDW: 13.8 % (ref 11.5–15.5)
WBC: 3.3 10*3/uL — ABNORMAL LOW (ref 4.0–10.5)
nRBC: 0 % (ref 0.0–0.2)

## 2019-03-15 LAB — MAGNESIUM: Magnesium: 2 mg/dL (ref 1.7–2.4)

## 2019-03-15 MED ORDER — SODIUM CHLORIDE 0.9 % IV SOLN
700.0000 mg | INTRAVENOUS | Status: DC
  Administered 2019-03-15: 700 mg via INTRAVENOUS
  Filled 2019-03-15 (×2): qty 14

## 2019-03-15 NOTE — Progress Notes (Signed)
PHARMACY NOTE:  ANTIMICROBIAL RENAL DOSAGE ADJUSTMENT  Current antimicrobial regimen includes a mismatch between antimicrobial dosage and estimated renal function.  As per policy approved by the Pharmacy & Therapeutics and Medical Executive Committees, the antimicrobial dosage will be adjusted accordingly.  Current antimicrobial dosage:  Daptomycin 6 mg/kg   Indication: MRSA left hip infection   Renal Function:  Estimated Creatinine Clearance: 96.6 mL/min (by C-G formula based on SCr of 1.04 mg/dL). []      On intermittent HD, scheduled: []      On CRRT    Antimicrobial dosage has been changed to:  Daptomycin 8 mg/kg (AdjBW)- ~ 700 mg every 24 hours   Additional comments: Increasing dose to target hip infection    Thank you for allowing pharmacy to be a part of this patient's care.  Jimmy Footman, PharmD, BCPS, BCIDP Infectious Diseases Clinical Pharmacist Phone: [phone]  03/15/2019 1:13 PM

## 2019-03-15 NOTE — Progress Notes (Addendum)
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 He is s/p Revision of left hip components including the polyethylene insert and the ceramic ball 36+1.5 on 7/15. He was readmitted on 7/26 for drug rash, leukocytosis with transaminitis from either rifampin or vancomycin. Now switched to daptomycin  S: rash improved denies itchy rash. No diarrhea. Does report dark stool but has been on iron  ROS: 12 point ros is negative O: afebrile. BP 114/76 (BP Location: Left Arm)   Pulse 76   Temp 99.5 F (37.5 C) (Oral)   Resp 20   Ht 5\' 10"  (1.778 m)   Wt 103.2 kg   SpO2 96%   BMI 32.65 kg/m  Physical Exam  Constitutional: He is oriented to person, place, and time. He appears well-developed and well-nourished. No distress.  HENT:  Mouth/Throat: Oropharynx is clear and moist. No oropharyngeal exudate.  Cardiovascular: Normal rate, regular rhythm and normal heart sounds. Exam reveals no gallop and no friction rub.  No murmur heard.  Pulmonary/Chest: Effort normal and breath sounds normal. No respiratory distress. He has no wheezes.  Abdominal: Soft. Bowel sounds are normal. He exhibits no distension. There is no tenderness.  Hip: left hip slightly swollen but no erythema Ext: right tka no swelling Neurological: He is alert and oriented to person, place, and time.  Skin: Skin is warm and dry. Resolving macular papular rash to arms, legs and back Psychiatric: He has a normal mood and affect. His behavior is normal.     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 Necessitating change in therapy  - daptomycin to increase dose to 8mg /kg/day until Aug 26th, and decide at that point to switch to oral abtx to complete 3 months course - due to transaminitis and rash which can be seen with rifampin, will not likely start either meidcaiton - will place new picc line  - get weekly ck and bmp while on daptomycin - will need to see OOP cost to ensure it is affordable.  transaminitis = still at 2.5X ULN. Recommend to make sure not taking any medications causing transaminitis. Repeat cmp at next week lab to see trending back to baseline

## 2019-03-15 NOTE — Progress Notes (Signed)
Patient ID: Cole Gill, male   DOB: 19-Sep-1965, 53 y.o.   MRN: [id number] Subjective:    Seems to be doing a lot better since seen on day of admission   [name].  Objective:   VITALS:   Vitals:   03/15/19 0557 03/15/19 1300  BP: (!) 142/76 114/76  Pulse: 75 76  Resp: 18 20  Temp: 100.3 F (37.9 C) 99.5 F (37.5 C)  SpO2: 92% 96%    Neurovascular intact   Left hip exam: Left hip incision, reduced swelling and erythema No drainage Dressing removed  LABS Recent Labs    03/13/19 0239 03/14/19 0625 03/15/19 0526  HGB 10.4* 10.2* 9.5*  HCT 31.8* 31.8* 30.0*  WBC 4.3 3.8* 3.3*  PLT 179 176 171    Recent Labs    03/13/19 0239 03/14/19 0625 03/15/19 0526  NA 130* 133* 136  K 3.7 3.5 3.9  BUN 17 14 12   CREATININE 1.28* 1.17 1.04  GLUCOSE 104* 111* 97    No results for input(s): LABPT, INR in the last 72 hours.   Assessment/Plan:  S/p left hip open excision and non excisional debridement of left hip status post total hip replacement for acute infection.  Drug reaction   Plan: Continue to observe left hip Agree with IV Daptomycin Needs to [facility] line Ordered nursing to re-apply aquacell dressing to left hip incision  I will follow up with him in the office after discharge in 2 weeks

## 2019-03-15 NOTE — TOC Initial Note (Signed)
Transition of Care [facility]) - Initial/Assessment Note    Patient Details  Name: Cayle Thunder MRN: [id number] Date of Birth: 19-Apr-1966  [facility]) CM/SW Contact:    Wende Neighbors, LCSW Phone Number: 03/15/2019, 2:02 PM  Clinical Narrative:                 CSW spoke with Carolynn Sayers and she stated that patient is being followed by [facility] and [employer]. Pam stated if patient dc home with IV antibiotics they will be able to follow patient in the home        Patient Goals and CMS Choice        Expected Discharge Plan and Services           Expected Discharge Date: (unknown)                                    Prior Living Arrangements/Services                       Activities of Daily Living Home Assistive Devices/Equipment: Cane (specify quad or straight), Eyeglasses, Bedside commode/3-in-1, Raised toilet seat with rails, Walker (specify type), CPAP(single point cane, front wheeled walker) ADL Screening (condition at time of admission) Patient's cognitive ability adequate to safely complete daily activities?: Yes Is the patient deaf or have difficulty hearing?: No Does the patient have difficulty seeing, even when wearing glasses/contacts?: No Does the patient have difficulty concentrating, remembering, or making decisions?: No Patient able to express need for assistance with ADLs?: Yes Does the patient have difficulty dressing or bathing?: Yes Independently performs ADLs?: No Communication: Independent Dressing (OT): Needs assistance Is this a change from baseline?: Pre-admission baseline Grooming: Needs assistance Is this a change from baseline?: Pre-admission baseline Feeding: Independent Bathing: Needs assistance Is this a change from baseline?: Pre-admission baseline Toileting: Independent with device (comment) In/Out Bed: Needs assistance Is this a change from baseline?: Pre-admission baseline Walks in Home:  Independent with device (comment) Does the patient have difficulty walking or climbing stairs?: Yes Weakness of Legs: Left Weakness of Arms/Hands: None  Permission Sought/Granted                  Emotional Assessment              Admission diagnosis:  Rash [R21] Elevated liver function tests [R94.5] Fever, unspecified fever cause [R50.9] Acute renal failure, unspecified acute renal failure type ([place]) [N17.9] Drug reaction [T50.905A] Patient Active Problem List   Diagnosis Date Noted  . Drug reaction 03/12/2019  . Essential hypertension 03/12/2019  . AKI (acute kidney injury) ([place]) 03/12/2019  . Abnormal transaminases 03/12/2019  . Prosthetic hip infection ([place]) 03/12/2019  . Fever 03/12/2019  . Post-operative infection 02/22/2019  . Obese 01/27/2019  . S/P left THA, AA 01/26/2019   PCP:  System, Pcp Not In Pharmacy:   [id number] [address], [place] [address] Phone: [phone] Fax: [phone]     Social Determinants of Health (SDOH) Interventions    Readmission Risk Interventions No flowsheet data found.

## 2019-03-15 NOTE — Progress Notes (Signed)
PROGRESS NOTE  Cole Gill WUJ:[id number] DOB: [birth year]   PCP: System, Pcp Not In  Patient is from: Home  DOA: 03/12/2019 LOS: 3  Brief Narrative / Interim history: 53 year old male with history of HTN, OSA, left hip replacement by Dr. Charlann Boxerlin on 01/26/2019 complicated by MRSA infection status post I&D with head ball and liner exchange on 02/23/2019 presenting with fever, hypotension and rash.  Since IV vancomycin was increased to 1500 mg twice daily on 03/04/2019.  Then he developed fever, headache, nausea, joint pain and skin rash that prompted him to come to ED 03/11/2019.[name].  On admission, hemodynamically stable.  Creatinine 1.82.  AST 272.  ALT 195.  ALP 243.  Lactic acid 1.6.  WBC 6.1.  Hgb 12.5.  Negative.  Blood cultures obtained.  Left hip x-ray revealed stable hip replacement without acute osseous finding.  ID and orthopedic surgery, Dr. Charlann Boxerlin consulted.  Initially started on Linezolid but transitioned to daptomycin by ID.  Subjective: Reports some difficulty sleeping and headache.  He says he normally uses CPAP at home.  He did not tolerate when it was tried while in ICU but like to try again tonight.  No other complaints.  Skin rash and pruritus improved.  Denies fever, sore throat, chest pain, shortness of breath, GI or GU symptoms.  Objective: Vitals:   03/14/19 0604 03/14/19 1209 03/14/19 2048 03/15/19 0557  BP:  127/70 137/77 (!) 142/76  Pulse:  67 67 75  Resp:  19  18  Temp: 100.1 F (37.8 C) 98.8 F (37.1 C) 99.1 F (37.3 C) 100.3 F (37.9 C)  TempSrc: Oral Oral Oral Oral  SpO2:  97% 97% 92%  Weight:      Height:        Intake/Output Summary (Last 24 hours) at 03/15/2019 1230 Last data filed at 03/15/2019 1049 Gross per 24 hour  Intake 540 ml  Output 800 ml  Net -260 ml   Filed Weights   03/12/19 1200  Weight: 103.2 kg    Examination:  GENERAL: No acute distress.  Appears well.  HEENT: MMM.  Vision and hearing grossly intact.  NECK: Supple.  No apparent  JVD.  RESP:  No IWOB. Good air movement bilaterally. CVS:  RRR. Heart sounds normal.  ABD/GI/GU: Bowel sounds present. Soft. Non tender.  MSK/EXT:  Moves extremities. No apparent deformity or edema.  SKIN: Generalized maculopapular rash.  Surgical wound appears clean and dry. NEURO: Awake, alert and oriented appropriately.  No gross deficit.  PSYCH: Calm. Normal affect.   I have personally reviewed the following labs and images:  Radiology Studies: No results found.  Microbiology: Recent Results (from the past 240 hour(s))  Blood culture (routine x 2)     Status: None (Preliminary result)   Collection Time: 03/12/19  5:15 AM   Specimen: BLOOD  Result Value Ref Range Status   Specimen Description   Final    BLOOD RIGHT ANTECUBITAL Performed at [facility], [address] [place], [place] [address]    Special Requests   Final    BOTTLES DRAWN AEROBIC AND ANAEROBIC Blood Culture results may not be optimal due to an excessive volume of blood received in culture bottles Performed at [facility], [address] [place], [place] [address]    Culture   Final    NO GROWTH 3 DAYS Performed at [facility], [address] [place], [place] [address]    Report Status PENDING  Incomplete  Blood culture (  routine x 2)     Status: None (Preliminary result)   Collection Time: 03/12/19  5:29 AM   Specimen: BLOOD  Result Value Ref Range Status   Specimen Description   Final    BLOOD RIGHT ANTECUBITAL Performed at [facility], [address] [place], [place] [address]    Special Requests   Final    BOTTLES DRAWN AEROBIC AND ANAEROBIC Blood Culture adequate volume Performed at [facility], [address] [place], [place] [address]    Culture   Final    NO GROWTH 3 DAYS Performed at [facility], [address] [place], [place] [address]    Report Status PENDING  Incomplete  SARS Coronavirus 2  (CEPHEID - Performed in [facility] hospital lab), Hosp Order     Status: None   Collection Time: 03/12/19  6:03 AM   Specimen: Nasopharyngeal Swab  Result Value Ref Range Status   SARS Coronavirus 2 NEGATIVE NEGATIVE Final    Comment: (NOTE) If result is NEGATIVE SARS-CoV-2 target nucleic acids are NOT DETECTED. The SARS-CoV-2 RNA is generally detectable in upper and lower  respiratory specimens during the acute phase of infection. The lowest  concentration of SARS-CoV-2 viral copies this assay can detect is 250  copies / mL. A negative result does not preclude SARS-CoV-2 infection  and should not be used as the sole basis for treatment or other  patient management decisions.  A negative result may occur with  improper specimen collection / handling, submission of specimen other  than nasopharyngeal swab, presence of viral mutation(s) within the  areas targeted by this assay, and inadequate number of viral copies  (<250 copies / mL). A negative result must be combined with clinical  observations, patient history, and epidemiological information. If result is POSITIVE SARS-CoV-2 target nucleic acids are DETECTED. The SARS-CoV-2 RNA is generally detectable in upper and lower  respiratory specimens dur ing the acute phase of infection.  Positive  results are indicative of active infection with SARS-CoV-2.  Clinical  correlation with patient history and other diagnostic information is  necessary to determine patient infection status.  Positive results do  not rule out bacterial infection or co-infection with other viruses. If result is PRESUMPTIVE POSTIVE SARS-CoV-2 nucleic acids MAY BE PRESENT.   A presumptive positive result was obtained on the submitted specimen  and confirmed on repeat testing.  While 2019 novel coronavirus  (SARS-CoV-2) nucleic acids may be present in the submitted sample  additional confirmatory testing may be necessary for epidemiological  and / or clinical  management purposes  to differentiate between  SARS-CoV-2 and other Sarbecovirus currently known to infect humans.  If clinically indicated additional testing with an alternate test  methodology [phone] is advised. The SARS-CoV-2 RNA is generally  detectable in upper and lower respiratory sp ecimens during the acute  phase of infection. The expected result is Negative. Fact Sheet for Patients:  BoilerBrush.com.cyhttps://www.fda.gov/media/136312/download Fact Sheet for Healthcare Providers: https://pope.com/https://www.fda.gov/media/136313/download This test is not yet approved or cleared by the [place] FDA and has been authorized for detection and/or diagnosis of SARS-CoV-2 by FDA under an Emergency Use Authorization (EUA).  This EUA will remain in effect (meaning this test can be used) for the duration of the COVID-19 declaration under Section 564(b)(1) of the Act, 21 U.S.C. section 360bbb-3(b)(1), unless the authorization is terminated or revoked sooner. Performed at [facility], [address] [place], [place] [address]   Urine culture  Status: None   Collection Time: 03/12/19  7:42 AM   Specimen: Urine, Clean Catch  Result Value Ref Range Status   Specimen Description   Final    URINE, CLEAN CATCH Performed at [facility], [address] [place], [place] [address]    Special Requests   Final    NONE Performed at [facility], [address] [place], [place] [address]    Culture   Final    NO GROWTH Performed at [facility], [address] [place], [place] [address]    Report Status 03/13/2019 FINAL  Final  MRSA PCR Screening     Status: None   Collection Time: 03/12/19 12:09 PM   Specimen: Nasal Mucosa; Nasopharyngeal  Result Value Ref Range Status   MRSA by PCR NEGATIVE NEGATIVE Final    Comment:        The GeneXpert MRSA Assay (FDA approved for NASAL specimens only), is one component of a comprehensive MRSA  colonization surveillance program. It is not intended to diagnose MRSA infection nor to guide or monitor treatment for MRSA infections. Performed at [facility], [address] [place], [place] [address]     Sepsis Labs: Invalid input(s): PROCALCITONIN, LACTICIDVEN  Urine analysis:    Component Value Date/Time   COLORURINE AMBER (A) 03/12/2019 0742   APPEARANCEUR HAZY (A) 03/12/2019 0742   LABSPEC 1.017 03/12/2019 0742   PHURINE 5.0 03/12/2019 0742   GLUCOSEU NEGATIVE 03/12/2019 0742   HGBUR NEGATIVE 03/12/2019 0742   BILIRUBINUR NEGATIVE 03/12/2019 0742   KETONESUR NEGATIVE 03/12/2019 0742   PROTEINUR 30 (A) 03/12/2019 0742   NITRITE NEGATIVE 03/12/2019 0742   LEUKOCYTESUR NEGATIVE 03/12/2019 0742    Anemia Panel: No results for input(s): VITAMINB12, FOLATE, FERRITIN, TIBC, IRON, RETICCTPCT in the last 72 hours.  Thyroid Function Tests: No results for input(s): TSH, T4TOTAL, FREET4, T3FREE, THYROIDAB in the last 72 hours.  Lipid Profile: No results for input(s): CHOL, HDL, LDLCALC, TRIG, CHOLHDL, LDLDIRECT in the last 72 hours.  CBG: No results for input(s): GLUCAP in the last 168 hours.  HbA1C: No results for input(s): HGBA1C in the last 72 hours.  BNP (last 3 results): No results for input(s): PROBNP in the last 8760 hours.  Cardiac Enzymes: Recent Labs  Lab 03/12/19 1617  CKTOTAL 36*    Coagulation Profile: No results for input(s): INR, PROTIME in the last 168 hours.  Liver Function Tests: Recent Labs  Lab 03/12/19 0515 03/13/19 0239 03/14/19 0625 03/15/19 0526  AST 272* 120* 83* 124*  ALT 195* 130* 105* 115*  ALKPHOS 243* 194* 185* 178*  BILITOT 2.7* 1.6* 0.9 0.7  PROT 7.6 5.5* 5.5* 5.4*  ALBUMIN 3.8 2.8* 2.7* 2.6*   No results for input(s): LIPASE, AMYLASE in the last 168 hours. No results for input(s): AMMONIA in the last 168 hours.  Basic Metabolic Panel: Recent Labs  Lab 03/12/19 0515 03/13/19 0239 03/14/19  0625 03/15/19 0526  NA 132* 130* 133* 136  K 3.8 3.7 3.5 3.9  CL 95* 97* 101 105  CO2 26 24 22 24   GLUCOSE 122* 104* 111* 97  BUN 25* 17 14 12   CREATININE 1.82* 1.28* 1.17 1.04  CALCIUM 8.7* 7.7* 7.9* 8.2*  MG  --   --  1.8 2.0   GFR: Estimated Creatinine Clearance: 96.6 mL/min (by C-G formula based on SCr of 1.04 mg/dL).  CBC: Recent Labs  Lab 03/12/19 0515 03/13/19 0239 03/14/19 0625 03/15/19 0526  WBC  6.1 4.3 3.8* 3.3*  NEUTROABS 5.3  --   --   --   HGB 12.5* 10.4* 10.2* 9.5*  HCT 37.7* 31.8* 31.8* 30.0*  MCV 94.5 94.9 95.8 95.5  PLT 239 179 176 171    Procedures:  None  Microbiology summarized: [id number] negative. Blood cultures negative so far Urine culture negative so far  Assessment & Plan: Left hip prosthetic joint infection: Stable. -Left hip replacement on 01/26/2019 -I&D on 02/23/2019.  Tissue culture grew MRSA. -Left hip x-ray on 7/30 showed stable left hip s/p replacement but no acute osseous finding -Blood and urine cultures negative so far. -Follow ID and ortho recs. -Vancomycin and rifampin stopped out of concern for drug reaction. -Continue daptomycin per ID. -PT/OT-none needed.  Fever/generalized maculopapular rash: Concern for drug reaction, possibly from Vanco and rifampin.  No leukocytosis.  Fever curve downtrending.  No cardiopulmonary or GI symptoms to suggest anaphylaxis.  Rash improving. -Continue Benadryl and Atarax as needed -Continue Pepcid -Antibiotic per ID.  AKI: Resolved.  Suspect ATN from vancomycin, Celebrex, valsartan and HCTZ. AIN is a possibility in the setting of drug reaction. -Stop those potential culprits -Continue monitoring -Discontinue IV fluids.  Elevated liver enzymes: drug reaction versus sepsis.  Tylenol level 13.  Acute hepatitis panel and RUQ  [place] normal.  Improving. -Continue monitoring  Hyponatremia: Valsartan and HCTZ could contribute.  Resolved. -Continue trending  Normocytic anemia: dilutional pattern  with all cell lines dropping.  There could be some element of hemoconcentration to begin with. -H&H stable.  Continue monitoring -Check anemia panel  DVT prophylaxis: Subcu Lovenox Code Status: Full code Family Communication: Patient and/or RN. Available if any question.  Disposition Plan: Remains inpatient on IV daptomycin pending clearance by infectious disease. Consultants: Infectious disease, orthopedic surgery   Antimicrobials: Anti-infectives (From admission, onward)   Start     Dose/Rate Route Frequency Ordered Stop   03/12/19 2200  DAPTOmycin (CUBICIN) 619 mg in sodium chloride 0.9 % IVPB     6 mg/kg  103.2 kg 224.8 mL/hr over 30 Minutes Intravenous Every 24 hours 03/12/19 1605     03/12/19 0615  piperacillin-tazobactam (ZOSYN) IVPB 3.375 g     3.375 g 100 mL/hr over 30 Minutes Intravenous  Once 03/12/19 0608 03/12/19 1324   03/12/19 0545  linezolid (ZYVOX) IVPB 600 mg     600 mg 300 mL/hr over 60 Minutes Intravenous  Once 03/12/19 0542 03/12/19 0659      Sch Meds:  Scheduled Meds: . aspirin  81 mg Oral Daily  . Chlorhexidine Gluconate Cloth  6 each Topical Q0600  . enoxaparin (LOVENOX) injection  40 mg Subcutaneous Q24H  . famotidine  20 mg Oral BID  . magnesium oxide  800 mg Oral QHS  . tamsulosin  0.8 mg Oral Daily  . Vilazodone HCl  20 mg Oral Daily   Continuous Infusions: . sodium chloride 100 mL/hr at 03/15/19 0943  . DAPTOmycin (CUBICIN)  IV 619 mg (03/14/19 2157)   PRN Meds:.acetaminophen, albuterol, diphenhydrAMINE, diphenhydrAMINE, fluticasone, HYDROmorphone, hydrOXYzine, methocarbamol, ondansetron **OR** ondansetron (ZOFRAN) IV, promethazine  Timmie Calix T. [place]  If 7PM-7AM, please contact night-coverage [email or web address] Password [email or web address] 03/15/2019, 12:30 PM

## 2019-03-16 ENCOUNTER — Inpatient Hospital Stay: Payer: Self-pay

## 2019-03-16 ENCOUNTER — Inpatient Hospital Stay (HOSPITAL_COMMUNITY): Payer: BC Managed Care – PPO

## 2019-03-16 DIAGNOSIS — R21 Rash and other nonspecific skin eruption: Secondary | ICD-10-CM

## 2019-03-16 DIAGNOSIS — A4902 Methicillin resistant Staphylococcus aureus infection, unspecified site: Secondary | ICD-10-CM

## 2019-03-16 DIAGNOSIS — Z95828 Presence of other vascular implants and grafts: Secondary | ICD-10-CM

## 2019-03-16 LAB — COMPREHENSIVE METABOLIC PANEL
ALT: 123 U/L — ABNORMAL HIGH (ref 0–44)
AST: 103 U/L — ABNORMAL HIGH (ref 15–41)
Albumin: 2.8 g/dL — ABNORMAL LOW (ref 3.5–5.0)
Alkaline Phosphatase: 164 U/L — ABNORMAL HIGH (ref 38–126)
Anion gap: 8 (ref 5–15)
BUN: 11 mg/dL (ref 6–20)
CO2: 25 mmol/L (ref 22–32)
Calcium: 8.3 mg/dL — ABNORMAL LOW (ref 8.9–10.3)
Chloride: 102 mmol/L (ref 98–111)
Creatinine, Ser: 0.88 mg/dL (ref 0.61–1.24)
GFR calc Af Amer: >60 mL/min (ref >60)
GFR calc non Af Amer: >60 mL/min (ref >60)
Glucose, Bld: 95 mg/dL (ref 70–99)
Potassium: 3.5 mmol/L (ref 3.5–5.1)
Sodium: 135 mmol/L (ref 135–145)
Total Bilirubin: 0.9 mg/dL (ref 0.3–1.2)
Total Protein: 5.5 g/dL — ABNORMAL LOW (ref 6.5–8.1)

## 2019-03-16 LAB — CBC
HCT: 29.8 % — ABNORMAL LOW (ref 39.0–52.0)
Hemoglobin: 9.5 g/dL — ABNORMAL LOW (ref 13.0–17.0)
MCH: 30.3 pg (ref 26.0–34.0)
MCHC: 31.9 g/dL (ref 30.0–36.0)
MCV: 94.9 fL (ref 80.0–100.0)
Platelets: 180 10*3/uL (ref 150–400)
RBC: 3.14 MIL/uL — ABNORMAL LOW (ref 4.22–5.81)
RDW: 13.7 % (ref 11.5–15.5)
WBC: 4 10*3/uL (ref 4.0–10.5)
nRBC: 0 % (ref 0.0–0.2)

## 2019-03-16 LAB — MAGNESIUM: Magnesium: 2.3 mg/dL (ref 1.7–2.4)

## 2019-03-16 LAB — ROCKY MTN SPOTTED FVR ABS PNL(IGG+IGM)
RMSF IgG: NEGATIVE
RMSF IgM: 0.26 index (ref 0.00–0.89)

## 2019-03-16 MED ORDER — HYDROXYZINE HCL 25 MG PO TABS: 25.0000 mg | ORAL_TABLET | Freq: Three times a day (TID) | ORAL | 0 refills | Status: DC | PRN

## 2019-03-16 MED ORDER — DAPTOMYCIN IV (FOR PTA / DISCHARGE USE ONLY): 700.0000 mg | INTRAVENOUS | 0 refills | Status: AC

## 2019-03-16 MED ORDER — SODIUM CHLORIDE 0.9% FLUSH: 10.0000 mL | INTRAVENOUS | Status: DC | PRN

## 2019-03-16 MED ORDER — LISINOPRIL-HYDROCHLOROTHIAZIDE 10-12.5 MG PO TABS: 1.0000 | ORAL_TABLET | Freq: Every day | ORAL | 1 refills | Status: DC

## 2019-03-16 MED ORDER — OXYCODONE HCL 5 MG PO TABS: 5.0000 mg | ORAL_TABLET | Freq: Four times a day (QID) | ORAL | Status: DC | PRN

## 2019-03-16 MED ORDER — SODIUM CHLORIDE 0.9 % IV SOLN: 8.0000 mg/kg | INTRAVENOUS | 0 refills | Status: DC

## 2019-03-16 NOTE — Discharge Summary (Signed)
Physician Discharge Summary  Cole Gill YTK:[id number] DOB: [birth year] DOA: 03/12/2019  PCP: System, Pcp Not In  Admit date: 03/12/2019 Discharge date: 03/16/2019  Admitted From: Home Disposition: Home  Recommendations for Outpatient Follow-up:  1. Follow up with PCP in 1-2 weeks 2. Follow-up with infectious disease and orthopedic surgery in 2 weeks 3. Check CBC with differential, BMP and CK weekly while on daptomycin. 4. Check ESR and CRP biweekly. 5. Please recheck LFT at follow-up. 6. Please follow up on the following pending results: None  Home Health: [facility] RN Equipment/Devices: None  Discharge Condition: Stable CODE STATUS: Full code  Hospital Course: 53 year old male with history of HTN, OSA, left hip replacement by Dr. Alvan Dame on 7/51/7001 complicated by MRSA infection status post I&D with head ball and liner exchange on 02/23/2019 presenting with fever, hypotension and rash.  Since IV vancomycin was increased to 1500 mg twice daily on 03/04/2019.  Then he developed fever, headache, nausea, joint pain and skin rash that prompted him to come to ED 03/11/2019.[name]  On admission, hemodynamically stable.  Creatinine 1.82.  AST 272.  ALT 195.  ALP 243.  Lactic acid 1.6.  WBC 6.1.  Hgb 12.5.  Negative.  Blood cultures obtained.  Left hip x-ray revealed stable hip replacement without acute osseous finding.  ID and orthopedic surgery, Dr. Alvan Dame consulted. Initially started on Linezolid but transitioned to daptomycin by ID the next day.  Patient was continued on daptomycin.  Fever resolved.  Skin rash and elevated liver enzymes improved.  Discharged home on IV daptomycin through 04/07/2019.  The decision to continue IV antibiotics or change to oral antibiotics will be made by infectious disease at follow-up.  Patient to follow-up with infectious disease and orthopedic surgery in 2 weeks.  Patient to get weekly CBC with differential, BMP and CK while on daptomycin.  He will get biweekly ESR and  CRP as well.  Recommend rechecking LFT at follow-up.  See individual problem list below for more.  Discharge Diagnoses:  Left hip prosthetic joint infection: Stable. -Left hip replacement on 01/26/2019 -I&D on 02/23/2019.  Tissue culture grew MRSA. -Left hip x-ray on 7/30 showed stable left hip s/p replacement but no acute osseous finding -Blood and urine cultures negative so far. -Vancomycin and rifampin stopped out of concern for drug reaction. -PICC line placed on 03/16/2019. -Continue daptomycin through 04/07/2019.  Further antibiotic course to be decided by ID at follow-up.  Weekly and biweekly labs as above. -Patient to follow-up with orthopedic surgery in 2 weeks.  Fever/generalized maculopapular rash: Concern for drug reaction, possibly from Vanco and rifampin.    Fever and leukocytosis resolved.  No cardiopulmonary or GI symptoms to suggest anaphylaxis.  Rash improving. -Antibiotic as above.  Hypertension: Normotensive without BP meds during this hospitalization. -Discontinued home amlodipine, valsartan and HCTZ. -Discharged on lisinopril/HCTZ 10/12.5 mg daily.  AKI: Resolved.  Suspect ATN from vancomycin, Celebrex, valsartan and HCTZ. AIN is a possibility in the setting of drug reaction. -Antihypertensive meds as above. -Discontinued Celebrex and vancomycin. -Weekly BMP while on daptomycin.  Elevated liver enzymes: drug reaction versus sepsis.  Tylenol level 13.  He was on high-dose Tylenol prior to admission.  Acute hepatitis panel and RUQ  [place] normal.  Improving. -Discontinued Tylenol -Recheck LFT at follow-up.  Hyponatremia: Valsartan and HCTZ could contribute.  Resolved. -Adjusted cardiac meds as above.  Normocytic anemia: dilutional pattern with all cell lines dropping.  There could be some element of hemoconcentration to begin with. -Weekly CBC with differential  while on daptomycin.  Discharge Instructions  Discharge Instructions    Call MD for:  hives    Complete by: As directed    Call MD for:  persistant dizziness or light-headedness   Complete by: As directed    Call MD for:  persistant nausea and vomiting   Complete by: As directed    Call MD for:  redness, tenderness, or signs of infection (pain, swelling, redness, odor or green/yellow discharge around incision site)   Complete by: As directed    Call MD for:  severe uncontrolled pain   Complete by: As directed    Call MD for:  temperature >100.4   Complete by: As directed    Diet - low sodium heart healthy   Complete by: As directed    Discharge instructions   Complete by: As directed    It has been a pleasure taking care of you! You were hospitalized with fever, skin rash and elevated liver enzymes thought to be due to drug reactions.  We changed you antibiotics to daptomycin that you will continue for 3 more weeks.  After 3 weeks, your doctors will decide if they need to continue the same antibiotic or change it to oral antibiotic.  Please review your new medication list and the directions before you take your medications. Please call your primary care office  as soon as possible to schedule hospital follow-up visit in 1 week to have blood work. Please call your surgeon and the infectious disease expert office as soon as possible to schedule hospital follow-up in 2 weeks.  Take care,   Increase activity slowly   Complete by: As directed      Allergies as of 03/16/2019      Reactions   Vancomycin Nausea Only, Other (See Comments)   Red man syndrome, headache      Medication List    STOP taking these medications   acetaminophen 500 MG tablet Commonly known as: TYLENOL   amLODIPine-Valsartan-HCTZ 10-320-25 MG Tabs   celecoxib 200 MG capsule Commonly known as: CELEBREX   pseudoephedrine 30 MG tablet Commonly known as: SUDAFED   rifampin 300 MG capsule Commonly known as: Rifadin   vancomycin  IVPB     TAKE these medications   aspirin 81 MG chewable  tablet Commonly known as: Aspirin Childrens Chew 1 tablet (81 mg total) by mouth 2 (two) times daily. Take for 4 weeks, then resume regular dose. What changed:   when to take this  additional instructions   DAPTOmycin 681 mg in sodium chloride 0.9 % 100 mL Inject 681 mg into the vein daily for 21 days. Start taking on: March 17, 2019   docusate sodium 100 MG capsule Commonly known as: Colace Take 1 capsule (100 mg total) by mouth 2 (two) times daily.   ferrous sulfate 325 (65 FE) MG tablet Commonly known as: FerrouSul Take 1 tablet (325 mg total) by mouth 3 (three) times daily with meals for 14 days. What changed: when to take this   fluticasone 50 MCG/ACT nasal spray Commonly known as: FLONASE Place 2 sprays into both nostrils 2 (two) times daily as needed for allergies.   HYDROmorphone 2 MG tablet Commonly known as: Dilaudid Take 1-2 tablets (2-4 mg total) by mouth every 4 (four) hours as needed for severe pain.   hydrOXYzine 25 MG tablet Commonly known as: ATARAX/VISTARIL Take 1 tablet (25 mg total) by mouth 3 (three) times daily as needed for itching.   lisinopril-hydrochlorothiazide 10-12.5 MG tablet  Commonly known as: Zestoretic Take 1 tablet by mouth daily.   Magnesium 400 MG Tabs Take 800 mg by mouth at bedtime.   methocarbamol 500 MG tablet Commonly known as: Robaxin Take 1 tablet (500 mg total) by mouth every 6 (six) hours as needed for muscle spasms.   polyethylene glycol 17 g packet Commonly known as: MIRALAX / GLYCOLAX Take 17 g by mouth 2 (two) times daily.   tamsulosin 0.4 MG Caps capsule Commonly known as: FLOMAX Take 0.8 mg by mouth daily.   testosterone cypionate 200 MG/ML injection Commonly known as: DEPOTESTOSTERONE CYPIONATE Inject 100 mg into the muscle once a week.   Viibryd 20 MG Tabs Generic drug: Vilazodone HCl Take 20 mg by mouth daily.      Follow-up Information    Paralee Cancel, MD Follow up in 2 week(s).   Specialty:  Orthopedic Surgery Why: for wound check and X-rays of left hip Contact information: [address] [place] [address] [phone]        Carlyle Basques, MD. Schedule an appointment as soon as possible for a visit in 2 week(s).   Specialty: Infectious Diseases Contact information: [place] [address] [phone]           Consultations:  Infectious disease  Orthopedic surgery  Procedures/Studies:  2D Echo: None  Dg Pelvis Portable  Result Date: 02/23/2019 CLINICAL DATA:  Status post hip replacement. Drainage from the left hip. Head, ball, and liner exchange. EXAM: PORTABLE PELVIS 1-2 VIEWS COMPARISON:  One-view pelvis 01/26/2019 FINDINGS: Bilateral total hip replacements are again noted. Foley catheter is in place. No fractures are present. The hips appear located on this AP view. IMPRESSION: 1. Bilateral hip replacements. No radiographic evidence for complication. Electronically Signed   By: San Morelle M.D.   On: 02/23/2019 17:17   Dg Chest Portable 1 View  Result Date: 03/12/2019 CLINICAL DATA:  Fever. EXAM: PORTABLE CHEST 1 VIEW COMPARISON:  None. FINDINGS: UPPER limits normal heart size noted. Mild pulmonary vascular congestion is noted. A LEFT central venous catheter is noted with tip overlying the LOWER SVC. There is no evidence of focal airspace disease, pulmonary edema, suspicious pulmonary nodule/mass, pleural effusion, or pneumothorax. Probable degenerative changes within both shoulders noted. IMPRESSION: UPPER limits normal heart size and mild pulmonary vascular congestion. Electronically Signed   By: Margarette Canada M.D.   On: 03/12/2019 08:39   Dg Hip Port Unilat W Or Wo Pelvis 1 View Left  Result Date: 03/12/2019 CLINICAL DATA:  Left hip replacement. Postop infection. Increase swelling and redness. EXAM: DG HIP (WITH OR WITHOUT PELVIS) 1V PORT LEFT COMPARISON:  No recent prior. FINDINGS: Total left hip  replacement. Hardware intact. Anatomic alignment. Stable tiny bony density noted adjacent to the lower portion of the acetabulum. No acute bony abnormality. No erosive changes. IMPRESSION: Total left hip replacement with stable appearance. No acute bony abnormality identified. No bony erosions noted. Electronically Signed   By: Marcello Moores  Register   On: 03/12/2019 06:07   [place] Ekg Site Rite  Result Date: 03/16/2019 If Site Rite image not attached, placement could not be confirmed due to current cardiac rhythm.  [place] Ekg Site Rite  Result Date: 03/15/2019 If Site Rite image not attached, placement could not be confirmed due to current cardiac rhythm.  [place] Ekg Site Rite  Result Date: 02/23/2019 If Site Rite image not attached, placement could not be confirmed due to current cardiac rhythm.  US Abdomen Limited Ruq  Result Date: 03/12/2019 CLINICAL DATA:  Elevated liver function test EXAM: ULTRASOUND ABDOMEN LIMITED RIGHT UPPER QUADRANT COMPARISON:  None. FINDINGS: Gallbladder: No gallstones or wall thickening visualized. No sonographic Murphy sign noted by sonographer. Common bile duct: Diameter: 3 mm Liver: No focal lesion identified. Within normal limits in parenchymal echogenicity. Portal vein is patent on color Doppler imaging with normal direction of blood flow towards the liver. IMPRESSION: Normal right upper quadrant ultrasound. Electronically Signed   By: Monte Fantasia M.D.   On: 03/12/2019 07:29      Subjective: No major events overnight of this morning.  No complaints this morning.  No further fever.  Skin rash improved.  No pruritus.  LFT downtrending.  Feels well and ready to go home.   Discharge Exam: Vitals:   03/15/19 2100 03/16/19 0557  BP: 140/84 134/83  Pulse: (!) 55 (!) 57  Resp: 16 16  Temp: 98.3 F (36.8 C) 98.1 F (36.7 C)  SpO2: 95% 94%    GENERAL: No acute distress.  Appears well.  HEENT: MMM.  Vision and hearing grossly intact.  NECK: Supple.  No JVD.  LUNGS:  No  IWOB. Good air movement bilaterally. HEART:  RRR. Heart sounds normal.  ABD: Bowel sounds present. Soft. Non tender.  MSK/EXT:  Moves all extremities. No apparent deformity. No edema bilaterally.  Surgical wound over left hip appears clean and dry. SKIN: Diffuse maculopapular rash. NEURO: Awake, alert and oriented appropriately.  No gross deficit.  PSYCH: Calm. Normal affect.   The results of significant diagnostics from this hospitalization (including imaging, microbiology, ancillary and laboratory) are listed below for reference.     Microbiology: Recent Results (from the past 240 hour(s))  Blood culture (routine x 2)     Status: None (Preliminary result)   Collection Time: 03/12/19  5:15 AM   Specimen: BLOOD  Result Value Ref Range Status   Specimen Description   Final    BLOOD RIGHT ANTECUBITAL Performed at [facility] [address], [place], [place] [address]    Special Requests   Final    BOTTLES DRAWN AEROBIC AND ANAEROBIC Blood Culture results may not be optimal due to an excessive volume of blood received in culture bottles Performed at [facility] [address], [place], [place] [address]    Culture   Final    NO GROWTH 4 DAYS Performed at [facility], [place] [address] [place], [place] [address]    Report Status PENDING  Incomplete  Blood culture (routine x 2)     Status: None (Preliminary result)   Collection Time: 03/12/19  5:29 AM   Specimen: BLOOD  Result Value Ref Range Status   Specimen Description   Final    BLOOD RIGHT ANTECUBITAL Performed at [facility] [address] [place], [place] [address]    Special Requests   Final    BOTTLES DRAWN AEROBIC AND ANAEROBIC Blood Culture adequate volume Performed at [facility] [address] [place], [place] [address]    Culture   Final    NO GROWTH 4 DAYS Performed at [facility], [place] [address] [place],  [place] [address]    Report Status PENDING  Incomplete  SARS Coronavirus 2 (CEPHEID - Performed in [facility] hospital lab), Hosp Order     Status: None   Collection Time: 03/12/19  6:03 AM   Specimen: Nasopharyngeal Swab  Result Value Ref Range Status   SARS  Coronavirus 2 NEGATIVE NEGATIVE Final    Comment: (NOTE) If result is NEGATIVE SARS-CoV-2 target nucleic acids are NOT DETECTED. The SARS-CoV-2 RNA is generally detectable in upper and lower  respiratory specimens during the acute phase of infection. The lowest  concentration of SARS-CoV-2 viral copies this assay can detect is 250  copies / mL. A negative result does not preclude SARS-CoV-2 infection  and should not be used as the sole basis for treatment or other  patient management decisions.  A negative result may occur with  improper specimen collection / handling, submission of specimen other  than nasopharyngeal swab, presence of viral mutation(s) within the  areas targeted by this assay, and inadequate number of viral copies  (<250 copies / mL). A negative result must be combined with clinical  observations, patient history, and epidemiological information. If result is POSITIVE SARS-CoV-2 target nucleic acids are DETECTED. The SARS-CoV-2 RNA is generally detectable in upper and lower  respiratory specimens dur ing the acute phase of infection.  Positive  results are indicative of active infection with SARS-CoV-2.  Clinical  correlation with patient history and other diagnostic information is  necessary to determine patient infection status.  Positive results do  not rule out bacterial infection or co-infection with other viruses. If result is PRESUMPTIVE POSTIVE SARS-CoV-2 nucleic acids MAY BE PRESENT.   A presumptive positive result was obtained on the submitted specimen  and confirmed on repeat testing.  While 2019 novel coronavirus  (SARS-CoV-2) nucleic acids may be present in the submitted sample  additional confirmatory  testing may be necessary for epidemiological  and / or clinical management purposes  to differentiate between  SARS-CoV-2 and other Sarbecovirus currently known to infect humans.  If clinically indicated additional testing with an alternate test  methodology [phone]) is advised. The SARS-CoV-2 RNA is generally  detectable in upper and lower respiratory sp ecimens during the acute  phase of infection. The expected result is Negative. Fact Sheet for Patients:  StrictlyIdeas.no Fact Sheet for Healthcare Providers: BankingDealers.co.za This test is not yet approved or cleared by the [place] FDA and has been authorized for detection and/or diagnosis of SARS-CoV-2 by FDA under an Emergency Use Authorization (EUA).  This EUA will remain in effect (meaning this test can be used) for the duration of the COVID-19 declaration under Section 564(b)(1) of the Act, 21 U.S.C. section 360bbb-3(b)(1), unless the authorization is terminated or revoked sooner. Performed at [facility], [place] [address] [place], [place] [address]   Urine culture     Status: None   Collection Time: 03/12/19  7:42 AM   Specimen: Urine, Clean Catch  Result Value Ref Range Status   Specimen Description   Final    URINE, CLEAN CATCH Performed at [facility], [place] [address], [place], [place] [address]    Special Requests   Final    NONE Performed at [facility], [place] [address], [place], [place] [address]    Culture   Final    NO GROWTH Performed at [facility], [place] [address] [place], [place] [address]    Report Status 03/13/2019 FINAL  Final  MRSA PCR Screening     Status: None   Collection Time: 03/12/19 12:09 PM   Specimen: Nasal Mucosa; Nasopharyngeal  Result Value Ref Range Status   MRSA by PCR NEGATIVE NEGATIVE Final    Comment:        The GeneXpert MRSA Assay (FDA approved for NASAL  specimens only), is one component of a comprehensive MRSA colonization surveillance program. It is not intended to diagnose MRSA infection nor to guide or monitor treatment for MRSA infections. Performed at [facility], [place] [address], [place], [place] [address]      Labs: BNP (last 3 results) No results for input(s): BNP in the last 8760 hours. Basic Metabolic Panel: Recent Labs  Lab 03/12/19 0515 03/13/19 0239 03/14/19 0625 03/15/19 0526 03/16/19 0451  NA 132* 130* 133* 136 135  K 3.8 3.7 3.5 3.9 3.5  CL 95* 97* 101 105 102  CO2 26 24 22 24 25   GLUCOSE 122* 104* 111* 97 95  BUN 25* 17 14 12 11   CREATININE 1.82* 1.28* 1.17 1.04 0.88  CALCIUM 8.7* 7.7* 7.9* 8.2* 8.3*  MG  --   --  1.8 2.0 2.3   Liver Function Tests: Recent Labs  Lab 03/12/19 0515 03/13/19 0239 03/14/19 0625 03/15/19 0526 03/16/19 0451  AST 272* 120* 83* 124* 103*  ALT 195* 130* 105* 115* 123*  ALKPHOS 243* 194* 185* 178* 164*  BILITOT 2.7* 1.6* 0.9 0.7 0.9  PROT 7.6 5.5* 5.5* 5.4* 5.5*  ALBUMIN 3.8 2.8* 2.7* 2.6* 2.8*   No results for input(s): LIPASE, AMYLASE in the last 168 hours. No results for input(s): AMMONIA in the last 168 hours. CBC: Recent Labs  Lab 03/12/19 0515 03/13/19 0239 03/14/19 0625 03/15/19 0526 03/16/19 0451  WBC 6.1 4.3 3.8* 3.3* 4.0  NEUTROABS 5.3  --   --   --   --   HGB 12.5* 10.4* 10.2* 9.5* 9.5*  HCT 37.7* 31.8* 31.8* 30.0* 29.8*  MCV 94.5 94.9 95.8 95.5 94.9  PLT 239 179 176 171 180   Cardiac Enzymes: Recent Labs  Lab 03/12/19 1617  CKTOTAL 36*   BNP: Invalid input(s): POCBNP CBG: No results for input(s): GLUCAP in the last 168 hours. D-Dimer No results for input(s): DDIMER in the last 72 hours. Hgb A1c No results for input(s): HGBA1C in the last 72 hours. Lipid Profile No results for input(s): CHOL, HDL, LDLCALC, TRIG, CHOLHDL, LDLDIRECT in the last 72 hours. Thyroid function studies No results for input(s): TSH, T4TOTAL,  T3FREE, THYROIDAB in the last 72 hours.  Invalid input(s): FREET3 Anemia work up No results for input(s): VITAMINB12, FOLATE, FERRITIN, TIBC, IRON, RETICCTPCT in the last 72 hours. Urinalysis    Component Value Date/Time   COLORURINE AMBER (A) 03/12/2019 0742   APPEARANCEUR HAZY (A) 03/12/2019 0742   LABSPEC 1.017 03/12/2019 0742   PHURINE 5.0 03/12/2019 0742   GLUCOSEU NEGATIVE 03/12/2019 0742   HGBUR NEGATIVE 03/12/2019 0742   BILIRUBINUR NEGATIVE 03/12/2019 0742   KETONESUR NEGATIVE 03/12/2019 0742   PROTEINUR 30 (A) 03/12/2019 0742   NITRITE NEGATIVE 03/12/2019 0742   LEUKOCYTESUR NEGATIVE 03/12/2019 0742   Sepsis Labs Invalid input(s): PROCALCITONIN,  WBC,  LACTICIDVEN   Time coordinating discharge: 40 minutes  SIGNED:  Mercy Riding, MD  [facility] 03/16/2019, 10:14 AM  If 7PM-7AM, please contact night-coverage [email or web address] Password [email or web address]

## 2019-03-16 NOTE — Progress Notes (Addendum)
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 C/b drug rash/reaction to vanco/rif  Patient had picc line placed Dapto covered by insurance  Reviewed meds  ROS: 12 point ros is negative  O:BP 133/84 (BP Location: Left Arm)   Pulse (!) 58   Temp 98.7 F (37.1 C) (Oral)   Resp 18   Ht 5\' 10"  (1.778 m)   Wt 103.2 kg   SpO2 96%   BMI 32.65 kg/m  Physical Exam  Constitutional: He is oriented to person, place, and time. He appears well-developed and well-nourished. No distress.  HENT:  Mouth/Throat: Oropharynx is clear and moist. No oropharyngeal exudate.  Cardiovascular: Normal rate, regular rhythm and normal heart sounds. Exam reveals no gallop and no friction rub.  No murmur heard.  Pulmonary/Chest: Effort normal and breath sounds normal. No respiratory distress. He has no wheezes.  Hip: left hip is bandaged no surrounding erythema Ext: right arm picc line in place that is c/d/i Skin: Skin is warm and dry. Maculopapular rash resolving Psychiatric: He has a normal mood and affect. His behavior is normal.     A/P: early PJI of Left THA s/p revision now complicated by antibiotic drug reaction/rash. Necessitating change in therapy  - daptomycin to increase dose to 8mg /kg/day (700mg  daily) until Aug 26th, and decide at that point to switch to oral abtx to complete 3 months course - due to transaminitis and rash which can be seen with rifampin, will not likely start either medication.  - transaminitis: avoid any medications cleared by liver/avoid acetominophen. Will get weekly cmp to ensure AST/ALT trending back to normal - get weekly ck and bmp while on daptomycin - will see back in the ID clinic the week of August 26th, myself or Dr Prince Rome. - okay to discharge home from ID standpoint   Spent 35 min with patient discussing treatment optiosn  Abhi Moccia B. [facility] [phone]

## 2019-03-16 NOTE — Progress Notes (Signed)
PHARMACY CONSULT NOTE FOR:  OUTPATIENT  PARENTERAL ANTIBIOTIC THERAPY (OPAT)  Indication: PJI of left THA  Regimen: Daptomycin 700 mg every 24 hours End date: 04/07/2019  IV antibiotic discharge orders are pended. To discharging provider:  please sign these orders via discharge navigator,  Select New Orders & click on the button choice - Manage This Unsigned Work.     Thank you for allowing pharmacy to be a part of this patient's care.  Jimmy Footman, PharmD, BCPS, [facility] Clinical Pharmacist Phone: [phone] 03/16/2019, 10:17 AM

## 2019-03-16 NOTE — Progress Notes (Signed)
Peripherally Inserted Central Catheter/Midline Placement  The IV Nurse has discussed with the patient and/or persons authorized to consent for the patient, the purpose of this procedure and the potential benefits and risks involved with this procedure.  The benefits include less needle sticks, lab draws from the catheter, and the patient may be discharged home with the catheter. Risks include, but not limited to, infection, bleeding, blood clot (thrombus formation), and puncture of an artery; nerve damage and irregular heartbeat and possibility to perform a PICC exchange if needed/ordered by physician.  Alternatives to this procedure were also discussed.  B[employer] Power PICC patient education guide, fact sheet on infection prevention and patient information card has been provided to patient /or left at bedside.    PICC/Midline Placement Documentation  PICC Single Lumen 03/47/42 PICC Basilic 44 cm 0 cm (Active)  Indication for Insertion or Continuance of Line Home intravenous therapies (PICC only) 03/16/19 1027  Exposed Catheter (cm) 0 cm 03/16/19 1027  Site Assessment Clean;Dry;Intact 03/16/19 1027  Line Status Flushed;Blood return noted 03/16/19 1027  Dressing Type Transparent 03/16/19 1027  Dressing Status Clean;Dry;Intact;Antimicrobial disc in place 03/16/19 1027  Line Adjustment (NICU/IV Team Only) Yes 03/16/19 1027  Dressing Intervention New dressing 03/16/19 1027  Dressing Change Due 03/23/19 03/16/19 1027       Christella Noa Albarece 03/16/2019, 10:28 AM

## 2019-03-16 NOTE — Progress Notes (Signed)
Patients discharge information discussed with patient and pts wife, Floy Sabina, including medications, follow ups, prescriptions. All personal belongings sent home with patient. Pt and pts wife have no further questions.

## 2019-03-17 LAB — CULTURE, BLOOD (ROUTINE X 2)
Culture: NO GROWTH
Culture: NO GROWTH
Special Requests: ADEQUATE

## 2019-03-18 ENCOUNTER — Inpatient Hospital Stay: Payer: BC Managed Care – PPO | Admitting: Infectious Diseases

## 2019-04-01 ENCOUNTER — Ambulatory Visit (INDEPENDENT_AMBULATORY_CARE_PROVIDER_SITE_OTHER): Payer: BC Managed Care – PPO | Admitting: Infectious Diseases

## 2019-04-01 ENCOUNTER — Other Ambulatory Visit: Payer: Self-pay

## 2019-04-01 ENCOUNTER — Telehealth: Payer: Self-pay | Admitting: Pharmacist

## 2019-04-01 ENCOUNTER — Encounter: Payer: Self-pay | Admitting: Infectious Diseases

## 2019-04-01 VITALS — BP 107/73 | HR 80 | Temp 98.0°F

## 2019-04-01 DIAGNOSIS — N179 Acute kidney failure, unspecified: Secondary | ICD-10-CM | POA: Diagnosis not present

## 2019-04-01 DIAGNOSIS — A4902 Methicillin resistant Staphylococcus aureus infection, unspecified site: Secondary | ICD-10-CM

## 2019-04-01 DIAGNOSIS — R21 Rash and other nonspecific skin eruption: Secondary | ICD-10-CM

## 2019-04-01 DIAGNOSIS — Z96649 Presence of unspecified artificial hip joint: Secondary | ICD-10-CM

## 2019-04-01 DIAGNOSIS — T8459XA Infection and inflammatory reaction due to other internal joint prosthesis, initial encounter: Secondary | ICD-10-CM | POA: Diagnosis not present

## 2019-04-01 MED ORDER — DOXYCYCLINE HYCLATE 100 MG PO TABS: 100.0000 mg | ORAL_TABLET | Freq: Two times a day (BID) | ORAL | 1 refills | Status: DC

## 2019-04-01 NOTE — Telephone Encounter (Signed)
Called and spoke to [place] at [facility] and gave verbal order to draw a weekly CRP with labs.  Mary verified information and provided confirmation.

## 2019-04-01 NOTE — Progress Notes (Signed)
Subjective:    Patient ID: Cole Gill, male    DOB: Dec 28, 1965, 53 y.o.   MRN: [id number]  HPI The patient is a 53 year old white male with osteoarthritis status post left hip arthroplasty and more recently MRSA left hip prosthetic joint infection who presents today for hospital follow-up visit following 2 recent hospitalizations.  He underwent an aspirate in outside hospital to his left hip that confirmed MRSA.  The following day on February 23, 2019 he underwent an I&D of his left hip with exchange of hardware sparing his left acetabular cup for which he had polyethylene liner exchange performed.  He was initially placed on IV vancomycin and oral rifampin post discharge, but was readmitted in late July for a febrile illness which she had acute renal failure and a disseminated rash.  There was initially concern for "red man" syndrome secondary to the timing of his vancomycin but as the rifampin reaction could appear similarly both of his antibiotics were discontinued.  Following day he was placed on daptomycin IV initially at 6 mg/kg and then increase to 8 mg/kg daily at the time of discharge on August 4.  Since his hospital discharge his rash continued improve; however, last week he developed a left leg swelling and his orthopedist started him on a prednisone Dosepak.  Since that time his swelling is improved and as has his rash.  His appetite remains fair and his ambulation has gradually improved as well.  He denies missing any doses of his antibiotics and is eager to have his PICC line removed next week.  Labs reviewed from August 18 shows a normal white blood cell count of 9400, rising hemoglobin of 12.3, increased platelets of 521,000, but normal renal function with a creatinine of 1.23.  CRP ordered was not obtained.  He is otherwise without complaints today.   Past Medical History:  Diagnosis Date  . Anxiety   . Arthritis    hips knees fingers  . Depression   . Hypertension    Dr. Burnett Harry  .  Sleep apnea    C-pap    Past Surgical History:  Procedure Laterality Date  . COLONOSCOPY  2015  . INCISION AND DRAINAGE Left 02/23/2019   Procedure: INCISION AND DRAINAGE DEEP LEFT HIP, HEAD BALL AND LINER EXCHANGE;  Surgeon: Paralee Cancel, MD;  Location: [facility];  Service: Orthopedics;  Laterality: Left;  . JOINT REPLACEMENT  2016  . KNEE ARTHROSCOPY WITH PATELLAR TENDON REPAIR Right 2017  . SHOULDER ARTHROSCOPY Left 2005  . TOTAL HIP ARTHROPLASTY Left 01/26/2019   Procedure: TOTAL HIP ARTHROPLASTY ANTERIOR APPROACH;  Surgeon: Paralee Cancel, MD;  Location: [facility];  Service: Orthopedics;  Laterality: Left;     No family history on file.   Social History   Tobacco Use  . Smoking status: Never Smoker  . Smokeless tobacco: Former Systems developer    Types: Chew  Substance Use Topics  . Alcohol use: Not Currently    Frequency: Never    Comment: rare  . Drug use: Never      has no history on file for sexual activity.   Outpatient Medications Prior to Visit  Medication Sig Dispense Refill  . daptomycin (CUBICIN) IVPB Inject 700 mg into the vein daily for 22 days. Indication:  PJI of left total hip arthroplasty Last Day of Therapy:  04/07/2019  Labs - Once weekly:  CBC/D, BMP, and CPK Labs - Every other week:  ESR and CRP 22 Units 0  . fluticasone (FLONASE) 50 MCG/ACT  nasal spray Place 2 sprays into both nostrils 2 (two) times daily as needed for allergies.     [name] HYDROmorphone (DILAUDID) 2 MG tablet Take 1-2 tablets (2-4 mg total) by mouth every 4 (four) hours as needed for severe pain. 60 tablet 0  . hydrOXYzine (ATARAX/VISTARIL) 25 MG tablet Take 1 tablet (25 mg total) by mouth 3 (three) times daily as needed for itching. 30 tablet 0  . lisinopril-hydrochlorothiazide (ZESTORETIC) 10-12.5 MG tablet Take 1 tablet by mouth daily. 90 tablet 1  . Magnesium 400 MG TABS Take 800 mg by mouth at bedtime.    . methocarbamol (ROBAXIN) 500 MG tablet Take 1 tablet (500 mg total) by mouth every 6 (six)  hours as needed for muscle spasms. 40 tablet 0  . tamsulosin (FLOMAX) 0.4 MG CAPS capsule Take 0.8 mg by mouth daily.     [name] testosterone cypionate (DEPOTESTOSTERONE CYPIONATE) 200 MG/ML injection Inject 100 mg into the muscle once a week.     . Vilazodone HCl (VIIBRYD) 20 MG TABS Take 20 mg by mouth daily.    [name] docusate sodium (COLACE) 100 MG capsule Take 1 capsule (100 mg total) by mouth 2 (two) times daily. 28 capsule 0  . ferrous sulfate (FERROUSUL) 325 (65 FE) MG tablet Take 1 tablet (325 mg total) by mouth 3 (three) times daily with meals for 14 days. (Patient taking differently: Take 325 mg by mouth daily. ) 42 tablet 0  . polyethylene glycol (MIRALAX / GLYCOLAX) 17 g packet Take 17 g by mouth 2 (two) times daily. 28 packet 0   No facility-administered medications prior to visit.      Allergies  Allergen Reactions  . Vancomycin Nausea Only and Other (See Comments)    Red man syndrome, headache      Review of Systems  Constitutional: Positive for fatigue. Negative for chills and fever.  HENT: Negative for congestion, hearing loss, rhinorrhea and sinus pressure.   Eyes: Negative for photophobia, pain, redness and visual disturbance.  Respiratory: Negative for apnea, cough, shortness of breath and wheezing.   Cardiovascular: Negative for chest pain and palpitations.  Gastrointestinal: Negative for abdominal pain, constipation, diarrhea, nausea and vomiting.  Endocrine: Negative for cold intolerance, heat intolerance, polydipsia and polyuria.  Genitourinary: Negative for decreased urine volume, dysuria, frequency, hematuria and testicular pain.  Musculoskeletal: Negative for back pain, myalgias and neck pain.  Skin: Positive for rash. Negative for pallor.       Much improved, now present mostly just to both forearms  Allergic/Immunologic: Negative for immunocompromised state.  Neurological: Negative for dizziness, seizures, syncope, speech difficulty and light-headedness.   Hematological: Does not bruise/bleed easily.  Psychiatric/Behavioral: Negative for agitation and hallucinations. The patient is not nervous/anxious.       Objective:     Vitals:   04/01/19 1501  BP: 107/73  Pulse: 80  Temp: 98 F (36.7 C)     Physical Exam Gen: pleasant, NAD, A&Ox 3 Head: NCAT, no temporal wasting evident EENT: PERRL, EOMI, MMM, adequate dentition Neck: supple, no JVD CV: NRRR, no murmurs evident Pulm: CTA bilaterally, no wheeze or retractions Abd: soft, NTND, +BS Extrems:  trace LE edema, 2+ pulses, RT arm PICC intact w/o surrounding erythema or tenderness at site MSK: Left hip incision healing well, slight focal rash with dime size lesion at approximately 2 o'clock along the mid pole of his incision presumably from recent bandaging/medical tape Skin: rash much improved and now primarily present only to both forearms, adequate skin turgor  Neuro: CN II-XII grossly intact, no focal neurologic deficits appreciated, gait was not assessed, A&Ox 3   Labs: Lab Results  Component Value Date   WBC 4.0 03/16/2019   HGB 9.5 (L) 03/16/2019   HCT 29.8 (L) 03/16/2019   MCV 94.9 03/16/2019   PLT 180 03/16/2019   Lab Results  Component Value Date   NA 135 03/16/2019   K 3.5 03/16/2019   CL 102 03/16/2019   CO2 25 03/16/2019   GLUCOSE 95 03/16/2019   BUN 11 03/16/2019   CREATININE 0.88 03/16/2019   CALCIUM 8.3 (L) 03/16/2019   MG 2.3 03/16/2019   Lab Results  Component Value Date   CRP 17.3 (H) 03/12/2019      Assessment & Plan:  The patient is a 53 y/o WM with OA who recently underwent a LT THA on 01/27/2019 and subsequently developed an early LT hip prosthetic joint infection due to MRSA who was re-admitted with fever, rash, ARF/ALF.   1. LT hip PJI - Wound cx from OSH on 02/22/2019 as well as operative cxs from 02/23/2019 both grew MRSA, so he was initially discharged on vancomycin and rifampin after he underwent an immediate exchange of hardware aside from  his retained acetabular cup (to which he did have a polyethylene liner exchange) on 02/23/2019. Given his issues with rash and fever, both his vancomycin and rifampin were d/c'ed. he was initially started on daptomycin 6 mg/kg and once he prove that he can tolerate this dose was increased further to 8 mg/kg daily at the time of his hospital discharge in early August.  Unfortunately, his home health company has not been drawing his CRP is ordered, so this is unavailable for [place] today.  I have asked her clinical pharmacist to ensure that his CRP will be drawn next week to further assess his response to antibiotic treatment.  Tentatively, he will complete his IV daptomycin on April 08, 2019 with a plan to transition him to oral doxycycline 100 mg p.o. twice daily thereafter for a probable 73-monthconsolidative course. Follow-up in 1 month for reassessment.  2. ARF - Now resolved. While the patient was receiving vancomycin and rifampin dual tx initially in July, he developed ARF along with fever and rash all consistent with a drug reaction. His Cr peaked at 1.82.  Labs from this week showed a creatinine of 1.23.  His CK level was normal at 52.  3. Rash -dramatically improved since his hospitalization.  He does have some reddened rash to his arms that remains rather nonspecific.  The patient reports that his orthopedist gave him a prednisone taper last week which he is currently in seventh day of a plan to him.  It is unclear if the patient was given prednisone due to orthopedic complaints or his rash however.

## 2019-04-01 NOTE — Patient Instructions (Signed)
Continue taking daptomycin until 04/07/2019 as originally planned. HH to draw CRP with next week's labs. Begin doxycycline 100 mg PO BID on 04/08/2019. Return to clinic in 4 weeks for appointment with Dr. Prince Rome.

## 2019-04-09 ENCOUNTER — Telehealth: Payer: Self-pay | Admitting: *Deleted

## 2019-04-09 NOTE — Telephone Encounter (Signed)
Spoke with [employer] and she took verbal order to pull PICC and advised will fax paperwork for signature.

## 2019-04-09 NOTE — Telephone Encounter (Signed)
Yes, his picc can be removed.

## 2019-04-09 NOTE — Telephone Encounter (Signed)
Patient and nurse called to ask if the PICC can be DC as the patient last dose of IV was 04/07/19 and he started oral on 04/08/19. After review of note that was the plan but no mention of pulling PICC so will have to contact provider to ger a verbal for PICC removal and give them a call back.

## 2019-04-09 NOTE — Telephone Encounter (Signed)
Per Dr Prince Rome called patient to inform that the PICC can be pulled.  Also called [facility] to give the pull PICC order and was placed in voice mail. Left a message with patient information and pull order and for them to call the office if they have any questions or need any additional information.

## 2019-05-05 ENCOUNTER — Encounter: Payer: Self-pay | Admitting: Infectious Diseases

## 2019-05-05 ENCOUNTER — Other Ambulatory Visit: Payer: Self-pay

## 2019-05-05 ENCOUNTER — Ambulatory Visit (INDEPENDENT_AMBULATORY_CARE_PROVIDER_SITE_OTHER): Payer: BC Managed Care – PPO | Admitting: Infectious Diseases

## 2019-05-05 VITALS — BP 177/104 | HR 64 | Temp 97.9°F

## 2019-05-05 DIAGNOSIS — A4902 Methicillin resistant Staphylococcus aureus infection, unspecified site: Secondary | ICD-10-CM | POA: Diagnosis not present

## 2019-05-05 DIAGNOSIS — Z96649 Presence of unspecified artificial hip joint: Secondary | ICD-10-CM | POA: Diagnosis not present

## 2019-05-05 DIAGNOSIS — T8459XA Infection and inflammatory reaction due to other internal joint prosthesis, initial encounter: Secondary | ICD-10-CM

## 2019-05-05 MED ORDER — DOXYCYCLINE HYCLATE 100 MG PO TABS: 100.0000 mg | ORAL_TABLET | Freq: Two times a day (BID) | ORAL | 2 refills | Status: DC

## 2019-05-05 NOTE — Patient Instructions (Signed)
Return to clinic in 2 months for appointment with Dr. Prince Rome. Continue doxycycline until next appointment.

## 2019-05-05 NOTE — Progress Notes (Signed)
Subjective:    Patient ID: Cole Gill, male    DOB: 04-07-1966, 53 y.o.   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 He was last seen in our clinic on 04/01/2019.  He underwent an aspirate in outside hospital to his left hip that confirmed MRSA.  The following day on February 23, 2019 he underwent an I&D of his left hip with exchange of hardware sparing his left acetabular cup for which he had polyethylene liner exchange performed.  He was initially placed on IV vancomycin and oral rifampin post discharge, but was readmitted in late July for a febrile illness with acute renal failure and a disseminated rash.  Both of his antibiotics were discontinued and he was placed on IV daptomycin for the completion of his IV induction treatment, and ending on April 08, 2019.  He then was transitioned to oral consolidative treatment with doxycycline 100 milligrams twice daily.  He has been tolerating medication well without side effects and most importantly he has not had any further rash following this change.  He does admit to increasing left hip swelling and persistent numbness to his feet over the last 1 week.  He states that last week he and his [place] went to the mountains on vacation when the symptoms began.  He denies any new injuries or scratches to his hip directly but does admit to walking on higher grade terrain than he had been at home with more hills and declines.  He denies any systemic fevers or chills and denies any insect exposures while on his trip he has an appointment to see his orthopedist Dr. Alvan Dame later this afternoon.   Past Medical History:  Diagnosis Date  . Anxiety   . Arthritis    hips knees fingers  . Depression   . Hypertension    Dr. Burnett Harry  . Sleep apnea    C-pap    Past Surgical  History:  Procedure Laterality Date  . COLONOSCOPY  2015  . INCISION AND DRAINAGE Left 02/23/2019   Procedure: INCISION AND DRAINAGE DEEP LEFT HIP, HEAD BALL AND LINER EXCHANGE;  Surgeon: Paralee Cancel, MD;  Location: [facility];  Service: Orthopedics;  Laterality: Left;  . JOINT REPLACEMENT  2016  . KNEE ARTHROSCOPY WITH PATELLAR TENDON REPAIR Right 2017  . SHOULDER ARTHROSCOPY Left 2005  . TOTAL HIP ARTHROPLASTY Left 01/26/2019   Procedure: TOTAL HIP ARTHROPLASTY ANTERIOR APPROACH;  Surgeon: Paralee Cancel, MD;  Location: [facility];  Service: Orthopedics;  Laterality: Left;     No family history on file.   Social History   Tobacco Use  . Smoking status: Never Smoker  . Smokeless tobacco: Former Systems developer    Types: Chew  Substance Use Topics  . Alcohol use: Not Currently    Frequency: Never    Comment: rare  . Drug use: Never      has no history on file for sexual activity.   Outpatient Medications Prior to Visit  Medication Sig Dispense Refill  . aspirin EC 81 MG tablet Take 81 mg by mouth 2 (two) times daily.    . celecoxib (CELEBREX) 200 MG capsule Take by mouth.    . doxycycline (VIBRA-TABS) 100 MG tablet Take 1 tablet (100 mg total) by mouth 2 (two) times daily. 60 tablet 1  . ferrous sulfate (  FERROUSUL) 325 (65 FE) MG tablet Take 1 tablet (325 mg total) by mouth 3 (three) times daily with meals for 14 days. (Patient taking differently: Take 325 mg by mouth daily. ) 42 tablet 0  . fluticasone (FLONASE) 50 MCG/ACT nasal spray Place 2 sprays into both nostrils 2 (two) times daily as needed for allergies.     [name] lisinopril (ZESTRIL) 10 MG tablet Take 10 mg by mouth daily.    . Magnesium 400 MG TABS Take 800 mg by mouth at bedtime.    . tamsulosin (FLOMAX) 0.4 MG CAPS capsule Take 0.8 mg by mouth daily.     [name] testosterone cypionate (DEPOTESTOSTERONE CYPIONATE) 200 MG/ML injection Inject 100 mg into the muscle once a week.     . Vilazodone HCl (VIIBRYD) 20 MG TABS Take 20 mg by mouth daily.     [name] HYDROmorphone (DILAUDID) 2 MG tablet Take 1-2 tablets (2-4 mg total) by mouth every 4 (four) hours as needed for severe pain. (Patient not taking: Reported on 05/05/2019) 60 tablet 0  . docusate sodium (COLACE) 100 MG capsule Take 1 capsule (100 mg total) by mouth 2 (two) times daily. 28 capsule 0  . hydrOXYzine (ATARAX/VISTARIL) 25 MG tablet Take 1 tablet (25 mg total) by mouth 3 (three) times daily as needed for itching. 30 tablet 0  . lisinopril-hydrochlorothiazide (ZESTORETIC) 10-12.5 MG tablet Take 1 tablet by mouth daily. 90 tablet 1  . methocarbamol (ROBAXIN) 500 MG tablet Take 1 tablet (500 mg total) by mouth every 6 (six) hours as needed for muscle spasms. (Patient not taking: Reported on 05/05/2019) 40 tablet 0  . polyethylene glycol (MIRALAX / GLYCOLAX) 17 g packet Take 17 g by mouth 2 (two) times daily. 28 packet 0   No facility-administered medications prior to visit.      Allergies  Allergen Reactions  . Vancomycin Nausea Only and Other (See Comments)    Red man syndrome, headache      Review of Systems  Constitutional: Negative for chills, fatigue and fever.  HENT: Negative for congestion, hearing loss, rhinorrhea and sinus pressure.   Eyes: Negative for photophobia, pain, redness and visual disturbance.  Respiratory: Negative for apnea, cough, shortness of breath and wheezing.   Cardiovascular: Negative for chest pain and palpitations.  Gastrointestinal: Negative for abdominal pain, constipation, diarrhea, nausea and vomiting.  Endocrine: Negative for cold intolerance, heat intolerance, polydipsia and polyuria.  Genitourinary: Negative for decreased urine volume, dysuria, frequency, hematuria and testicular pain.  Musculoskeletal: Positive for arthralgias and joint swelling. Negative for back pain, myalgias and neck pain.       LT hip/groin  Skin: Negative for pallor and rash.       Much improved, now present mostly just to both forearms  Allergic/Immunologic:  Negative for immunocompromised state.  Neurological: Negative for dizziness, seizures, syncope, speech difficulty and light-headedness.  Hematological: Does not bruise/bleed easily.  Psychiatric/Behavioral: Negative for agitation and hallucinations. The patient is not nervous/anxious.        Objective:     Vitals:   05/05/19 1128  BP: (!) 177/104  Pulse: 64  Temp: 97.9 F (36.6 C)  SpO2: 97%     Physical Exam Gen: pleasant, NAD, A&Ox 3 Head: NCAT, no temporal wasting evident EENT: PERRL, EOMI, MMM, adequate dentition Neck: supple, no JVD CV: NRRR, no murmurs evident Pulm: CTA bilaterally, no wheeze or retractions Abd: soft, NTND, +BS Extrems:  trace LE edema, 2+ pulses MSK: Left hip incision healing well, significant firm edema along  the medial aspect of his wound easily palpable with slight erythema, no drainage expressed from incision site Skin: rash now resolved, adequate skin turgor Neuro: CN II-XII grossly intact, no focal neurologic deficits appreciated, gait was not assessed, A&Ox 3   Labs: Lab Results  Component Value Date   WBC 4.0 03/16/2019   HGB 9.5 (L) 03/16/2019   HCT 29.8 (L) 03/16/2019   MCV 94.9 03/16/2019   PLT 180 03/16/2019   Lab Results  Component Value Date   NA 135 03/16/2019   K 3.5 03/16/2019   CL 102 03/16/2019   CO2 25 03/16/2019   GLUCOSE 95 03/16/2019   BUN 11 03/16/2019   CREATININE 0.88 03/16/2019   CALCIUM 8.3 (L) 03/16/2019   MG 2.3 03/16/2019   Lab Results  Component Value Date   CRP 17.3 (H) 03/12/2019      Assessment & Plan:  The patient is a 53 y/o WM with OA who recently underwent a LT THA on 01/27/2019 and subsequently developed an early LT hip prosthetic joint infection due to MRSA who was re-admitted with fever, rash, ARF/ALF.   1. LT hip PJI - Wound cx from OSH on 02/22/2019 as well as operative cxs from 02/23/2019 both grew MRSA, so he was initially discharged on vancomycin and rifampin after he underwent an  immediate exchange of hardware aside from his retained acetabular cup (to which he did have a polyethylene liner exchange) on 02/23/2019. Given his issues with rash and fever, both his vancomycin and rifampin were d/c'ed. He completed IV induction tx with daptomycin 8 mg/kg daily on April 08, 2019. He started consolidative ABX with PO doxycycline the following day. I anticipate a probable 10-month consolidative course dependent on his clinical course, WBCs, and inflammatory markers. Will repeat CBC w/ diff, CMP, and CRP today. He has an appointment with his orthopedist today. Doxycycline was refilled today. Follow-up in 2 month for reassessment.  2. ARF - Now resolved. While the patient was receiving vancomycin and rifampin dual tx initially in July, he developed ARF along with fever and rash all consistent with a drug reaction. His Cr peaked at 1.82.  Labs near the completion of his IV daptomycin showed a creatinine of 1.23.  His CK level was normal at 52.  3. Rash -dramatically improved since his hospitalization.  He does have some reddened rash to his arms that remains rather nonspecific.  He completed a brief prednisone course following his hospital d/c.

## 2019-05-06 LAB — CBC WITH DIFFERENTIAL/PLATELET
Absolute Monocytes: 576 cells/uL (ref 200–950)
Basophils Absolute: 42 cells/uL (ref 0–200)
Basophils Relative: 0.7 %
Eosinophils Absolute: 180 cells/uL (ref 15–500)
Eosinophils Relative: 3 %
HCT: 42.6 % (ref 38.5–50.0)
Hemoglobin: 13.9 g/dL (ref 13.2–17.1)
Lymphs Abs: 840 cells/uL — ABNORMAL LOW (ref 850–3900)
MCH: 29.4 pg (ref 27.0–33.0)
MCHC: 32.6 g/dL (ref 32.0–36.0)
MCV: 90.3 fL (ref 80.0–100.0)
MPV: 10.3 fL (ref 7.5–12.5)
Monocytes Relative: 9.6 %
Neutro Abs: 4362 cells/uL (ref 1500–7800)
Neutrophils Relative %: 72.7 %
Platelets: 238 10*3/uL (ref 140–400)
RBC: 4.72 10*6/uL (ref 4.20–5.80)
RDW: 14.6 % (ref 11.0–15.0)
Total Lymphocyte: 14 %
WBC: 6 10*3/uL (ref 3.8–10.8)

## 2019-05-06 LAB — COMPREHENSIVE METABOLIC PANEL
AG Ratio: 1.6 (calc) (ref 1.0–2.5)
ALT: 28 U/L (ref 9–46)
AST: 25 U/L (ref 10–35)
Albumin: 3.8 g/dL (ref 3.6–5.1)
Alkaline phosphatase (APISO): 66 U/L (ref 35–144)
BUN/Creatinine Ratio: 26 (calc) — ABNORMAL HIGH (ref 6–22)
BUN: 30 mg/dL — ABNORMAL HIGH (ref 7–25)
CO2: 28 mmol/L (ref 20–32)
Calcium: 9.2 mg/dL (ref 8.6–10.3)
Chloride: 104 mmol/L (ref 98–110)
Creat: 1.16 mg/dL (ref 0.70–1.33)
Globulin: 2.4 g/dL (calc) (ref 1.9–3.7)
Glucose, Bld: 88 mg/dL (ref 65–99)
Potassium: 4.4 mmol/L (ref 3.5–5.3)
Sodium: 139 mmol/L (ref 135–146)
Total Bilirubin: 0.3 mg/dL (ref 0.2–1.2)
Total Protein: 6.2 g/dL (ref 6.1–8.1)

## 2019-05-06 LAB — C-REACTIVE PROTEIN: CRP: 2.4 mg/L (ref <8.0)

## 2019-07-06 ENCOUNTER — Ambulatory Visit (INDEPENDENT_AMBULATORY_CARE_PROVIDER_SITE_OTHER): Payer: BC Managed Care – PPO | Admitting: Infectious Diseases

## 2019-07-06 ENCOUNTER — Other Ambulatory Visit: Payer: Self-pay

## 2019-07-06 ENCOUNTER — Encounter: Payer: Self-pay | Admitting: Infectious Diseases

## 2019-07-06 VITALS — BP 149/74 | HR 71 | Temp 97.8°F | Wt 244.0 lb

## 2019-07-06 DIAGNOSIS — A4902 Methicillin resistant Staphylococcus aureus infection, unspecified site: Secondary | ICD-10-CM | POA: Diagnosis not present

## 2019-07-06 DIAGNOSIS — Z96649 Presence of unspecified artificial hip joint: Secondary | ICD-10-CM | POA: Diagnosis not present

## 2019-07-06 DIAGNOSIS — T8459XA Infection and inflammatory reaction due to other internal joint prosthesis, initial encounter: Secondary | ICD-10-CM

## 2019-07-06 MED ORDER — DOXYCYCLINE HYCLATE 100 MG PO TABS: 100.0000 mg | ORAL_TABLET | Freq: Two times a day (BID) | ORAL | 3 refills | Status: AC

## 2019-07-06 NOTE — Patient Instructions (Signed)
Continue doxycycline twice daily until next visit. Return to clinic in 3 months for appointment with Dr. Prince Rome.

## 2019-07-06 NOTE — Progress Notes (Signed)
Subjective:    Patient ID: Cole Gill, male    DOB: [birth year], 53 y.o.   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  He was last seen in our clinic on May 05, 2019 at which time he was continuing oral consolidative treatment with doxycycline.  He initially underwent an aspirate at an outside hospital to his left hip that confirmed MRSA.  The following day on February 23, 2019 he underwent an I&D of his left hip with exchange of hardware sparing his left acetabular cup for which he had polyethylene liner exchange performed.  He was initially placed on IV vancomycin and oral rifampin post discharge, but was readmitted in late July for a febrile illness with acute renal failure and a disseminated rash.  There was initially concern for "red man" syndrome secondary to the timing of his vancomycin but as the rifampin reaction could appear similarly both of his antibiotics were discontinued.  The following day he was placed on daptomycin IV daily.  His rash gradually resolved over the next several weeks.  He  Completed 6 weeks of induction IV ABX in late August and was then transitioned to oral doxycyline thereafter.  Since his last visit, he has developed diffuse arthralgias and was evaluated by a rheumatologist in [place], [place] who concluded that most of his symptoms were due to osteoarthritis.  It is unclear whether the possibility of drug-induced lupus was discussed at that visit.  The rheumatologist was hesitant to give the patient systemic immunosuppressives given his active treatment for his prosthetic joint infection.  He has had some stiffness to his left hip and particularly his low back area, so his orthopedist Dr. Charlann Boxer has ordered outpatient physical therapy for him to begin this coming week.  He has missed no doses of  his antibiotic and his appetite has been steadily improving since transitioning to oral doxycycline.  He denies any side effects from his medication.  His wife is present at today's visit.   Past Medical History:  Diagnosis Date   Anxiety    Arthritis    hips knees fingers   Depression    Hypertension    Dr. Georgina Pillion   Sleep apnea    C-pap    Past Surgical History:  Procedure Laterality Date   COLONOSCOPY  2015   INCISION AND DRAINAGE Left 02/23/2019   Procedure: INCISION AND DRAINAGE DEEP LEFT HIP, HEAD BALL AND LINER EXCHANGE;  Surgeon: Durene Romans, MD;  Location: [facility];  Service: Orthopedics;  Laterality: Left;   JOINT REPLACEMENT  2016   KNEE ARTHROSCOPY WITH PATELLAR TENDON REPAIR Right 2017   SHOULDER ARTHROSCOPY Left 2005   TOTAL HIP ARTHROPLASTY Left 01/26/2019   Procedure: TOTAL HIP ARTHROPLASTY ANTERIOR APPROACH;  Surgeon: Durene Romans, MD;  Location: [facility];  Service: Orthopedics;  Laterality: Left;     No family history on file.   Social History   Tobacco Use   Smoking status: Never Smoker   Smokeless tobacco: Former Neurosurgeon    Types: Chew  Substance Use Topics   Alcohol use: Not Currently    Frequency: Never    Comment: rare   Drug use: Never      has no history on file for sexual activity.   Outpatient Medications Prior to Visit  Medication Sig Dispense Refill   aspirin EC 81 MG  tablet Take 81 mg by mouth 2 (two) times daily.     doxycycline (VIBRA-TABS) 100 MG tablet Take 1 tablet (100 mg total) by mouth 2 (two) times daily. 60 tablet 2   lisinopril (ZESTRIL) 10 MG tablet Take 10 mg by mouth daily.     Magnesium 400 MG TABS Take 800 mg by mouth at bedtime.     metoprolol succinate (TOPROL-XL) 50 MG 24 hr tablet metoprolol succinate ER 50 mg tablet,extended release 24 hr  TAKE 1 TABLET BY MOUTH EVERY DAY     tamsulosin (FLOMAX) 0.4 MG CAPS capsule Take 0.8 mg by mouth daily.      testosterone cypionate (DEPOTESTOSTERONE CYPIONATE)  200 MG/ML injection Inject 100 mg into the muscle once a week.      Vilazodone HCl (VIIBRYD) 20 MG TABS Take 20 mg by mouth daily.     celecoxib (CELEBREX) 200 MG capsule Take by mouth.     diclofenac (VOLTAREN) 75 MG EC tablet Take 75 mg by mouth 2 (two) times daily as needed.     ferrous sulfate (FERROUSUL) 325 (65 FE) MG tablet Take 1 tablet (325 mg total) by mouth 3 (three) times daily with meals for 14 days. (Patient taking differently: Take 325 mg by mouth daily. ) 42 tablet 0   fluticasone (FLONASE) 50 MCG/ACT nasal spray Place 2 sprays into both nostrils 2 (two) times daily as needed for allergies.      HYDROmorphone (DILAUDID) 2 MG tablet Take 1-2 tablets (2-4 mg total) by mouth every 4 (four) hours as needed for severe pain. (Patient not taking: Reported on 05/05/2019) 60 tablet 0   No facility-administered medications prior to visit.      Allergies  Allergen Reactions   Vancomycin Nausea Only and Other (See Comments)    Red man syndrome, headache      Review of Systems  Constitutional: Positive for fatigue. Negative for chills and fever.  HENT: Negative for congestion, hearing loss, rhinorrhea and sinus pressure.   Eyes: Negative for photophobia, pain, redness and visual disturbance.  Respiratory: Negative for apnea, cough, shortness of breath and wheezing.   Cardiovascular: Negative for chest pain and palpitations.  Gastrointestinal: Negative for abdominal pain, constipation, diarrhea, nausea and vomiting.  Endocrine: Negative for cold intolerance, heat intolerance, polydipsia and polyuria.  Genitourinary: Negative for decreased urine volume, dysuria, frequency, hematuria and testicular pain.  Musculoskeletal: Positive for arthralgias and back pain. Negative for myalgias and neck pain.  Skin: Negative for pallor and rash.  Allergic/Immunologic: Negative for immunocompromised state.  Neurological: Negative for dizziness, seizures, syncope, speech difficulty and  light-headedness.  Hematological: Does not bruise/bleed easily.  Psychiatric/Behavioral: Negative for agitation and hallucinations. The patient is not nervous/anxious.       Objective:     Vitals:   07/06/19 1346  BP: (!) 149/74  Pulse: 71  Temp: 97.8 F (36.6 C)     Physical Exam Gen: pleasant, mild distress secondary to low back and LT hip pain/stiffness, A&Ox 3 Head: NCAT, no temporal wasting evident EENT: PERRL, EOMI, MMM, adequate dentition Neck: supple, no JVD CV: NRRR, no murmurs evident Pulm: CTA bilaterally, no wheeze or retractions Abd: soft, NTND, +BS Extrems: trace LE edema, 2+ pulses Skin: ruddy complexion, no rashes, adequate skin turgor Neuro: CN II-XII grossly intact, no focal neurologic deficits appreciated, gait was leaned forward (from shoulders leading) and occasionally shuffling but did not require assistive devices at today's visit, A&Ox 3  Labs: Lab Results  Component Value Date  WBC 6.0 05/05/2019   HGB 13.9 05/05/2019   HCT 42.6 05/05/2019   MCV 90.3 05/05/2019   PLT 238 05/05/2019   Lab Results  Component Value Date   NA 139 05/05/2019   K 4.4 05/05/2019   CL 104 05/05/2019   CO2 28 05/05/2019   GLUCOSE 88 05/05/2019   BUN 30 (H) 05/05/2019   CREATININE 1.16 05/05/2019   CALCIUM 9.2 05/05/2019   MG 2.3 03/16/2019   Lab Results  Component Value Date   CRP 2.4 05/05/2019      Assessment & Plan:  The patient is a 53 y/o WM with OA who recently underwent a LT THA on 01/27/2019 and subsequently developed an early LT hip prosthetic joint infection due to MRSA complicated by fever, rash, ARF/ALF.   1. LT hip PJI - Wound cx from OSH on 02/22/2019 as well as operative cxs from 02/23/2019 both grew MRSA, so he was initially discharged on vancomycin and rifampin after he underwent an immediate exchange of hardware aside from his retained acetabular cup (to which he did have a polyethylene liner exchange) on 02/23/2019. Given his issues with rash  and fever, both his vancomycin and rifampin were d/c'ed. he was initially started on daptomycin 6 mg/kg and once he prove that he can tolerate this dose was increased further to 8 mg/kg daily at the time of his hospital discharge in early August. He complete his IV daptomycin for induction treatment on April 08, 2019 and has successfully transitioned to oral doxycycline 100 mg p.o. twice daily thereafter for an anticipated 102-month consolidative course.  His inflammatory markers have steadily improved with ongoing treatment, initially measuring 17.3 for a CRP and declining to 2.4 at his last visit.  We will repeat the patient's CRP, CBC, and CMP at today's visit.  I will refill his doxycycline for the next 3 months and see him back at that time.  I would prefer that the patient not begin systemic steroids or other immunosuppressive's until he is completed consolidative therapy of 6 months.  However, steroid injections to joints in his upper body or neck would be safe to give but would prefer that no injections be given to either hips or low back as this may allow his suppressed MRSA infection to progress.  2. ARF/rash - Both have now resolved.

## 2019-07-07 LAB — COMPREHENSIVE METABOLIC PANEL
AG Ratio: 1.7 (calc) (ref 1.0–2.5)
ALT: 22 U/L (ref 9–46)
AST: 21 U/L (ref 10–35)
Albumin: 4.1 g/dL (ref 3.6–5.1)
Alkaline phosphatase (APISO): 75 U/L (ref 35–144)
BUN/Creatinine Ratio: 19 (calc) (ref 6–22)
BUN: 26 mg/dL — ABNORMAL HIGH (ref 7–25)
CO2: 31 mmol/L (ref 20–32)
Calcium: 9.7 mg/dL (ref 8.6–10.3)
Chloride: 101 mmol/L (ref 98–110)
Creat: 1.34 mg/dL — ABNORMAL HIGH (ref 0.70–1.33)
Globulin: 2.4 g/dL (calc) (ref 1.9–3.7)
Glucose, Bld: 87 mg/dL (ref 65–99)
Potassium: 4.4 mmol/L (ref 3.5–5.3)
Sodium: 138 mmol/L (ref 135–146)
Total Bilirubin: 0.6 mg/dL (ref 0.2–1.2)
Total Protein: 6.5 g/dL (ref 6.1–8.1)

## 2019-07-07 LAB — C-REACTIVE PROTEIN: CRP: 1.4 mg/L (ref <8.0)

## 2019-07-07 LAB — CBC WITH DIFFERENTIAL/PLATELET
Absolute Monocytes: 557 cells/uL (ref 200–950)
Basophils Absolute: 58 cells/uL (ref 0–200)
Basophils Relative: 1.2 %
Eosinophils Absolute: 250 cells/uL (ref 15–500)
Eosinophils Relative: 5.2 %
HCT: 47.2 % (ref 38.5–50.0)
Hemoglobin: 16.1 g/dL (ref 13.2–17.1)
Lymphs Abs: 1090 cells/uL (ref 850–3900)
MCH: 29.5 pg (ref 27.0–33.0)
MCHC: 34.1 g/dL (ref 32.0–36.0)
MCV: 86.6 fL (ref 80.0–100.0)
MPV: 10.2 fL (ref 7.5–12.5)
Monocytes Relative: 11.6 %
Neutro Abs: 2846 cells/uL (ref 1500–7800)
Neutrophils Relative %: 59.3 %
Platelets: 273 10*3/uL (ref 140–400)
RBC: 5.45 10*6/uL (ref 4.20–5.80)
RDW: 14.9 % (ref 11.0–15.0)
Total Lymphocyte: 22.7 %
WBC: 4.8 10*3/uL (ref 3.8–10.8)

## 2019-10-08 ENCOUNTER — Other Ambulatory Visit: Payer: Self-pay | Admitting: Orthopedic Surgery

## 2019-10-08 DIAGNOSIS — M545 Low back pain, unspecified: Secondary | ICD-10-CM

## 2019-10-12 ENCOUNTER — Ambulatory Visit: Payer: BC Managed Care – PPO | Admitting: Infectious Diseases

## 2020-08-15 IMAGING — RF OPERATIVE LEFT HIP WITH PELVIS
1 series · 9 of 9 positions shown · non-contrast
Comparison: None.

CLINICAL DATA: Left anterior hip replacement.

EXAM:
OPERATIVE left HIP (WITH PELVIS IF PERFORMED) 9 VIEWS
TECHNIQUE: Fluoroscopic spot image(s) were submitted for interpretation
post-operatively.

[Series 1: unknown protocol · 0.20mm/px · 9 of 9 slices shown]
[im 1/9]
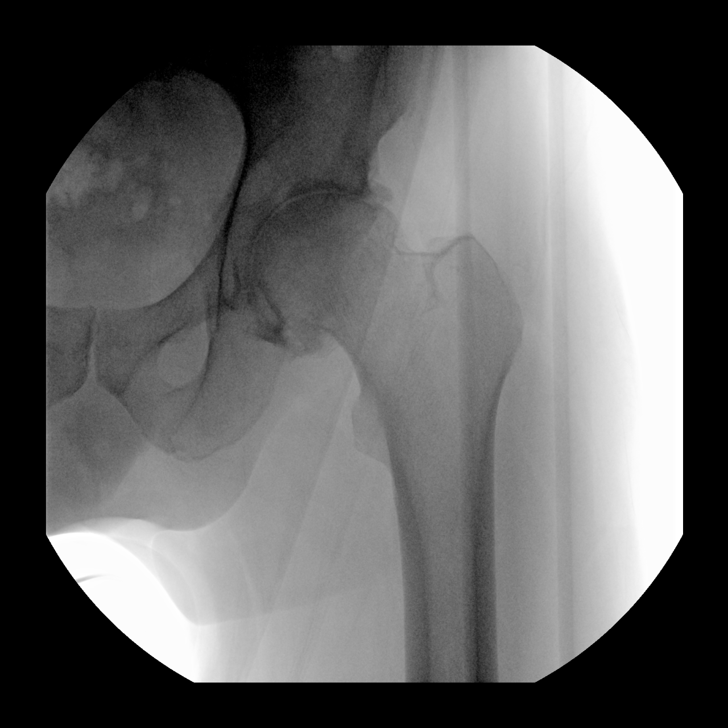
[im 2/9]
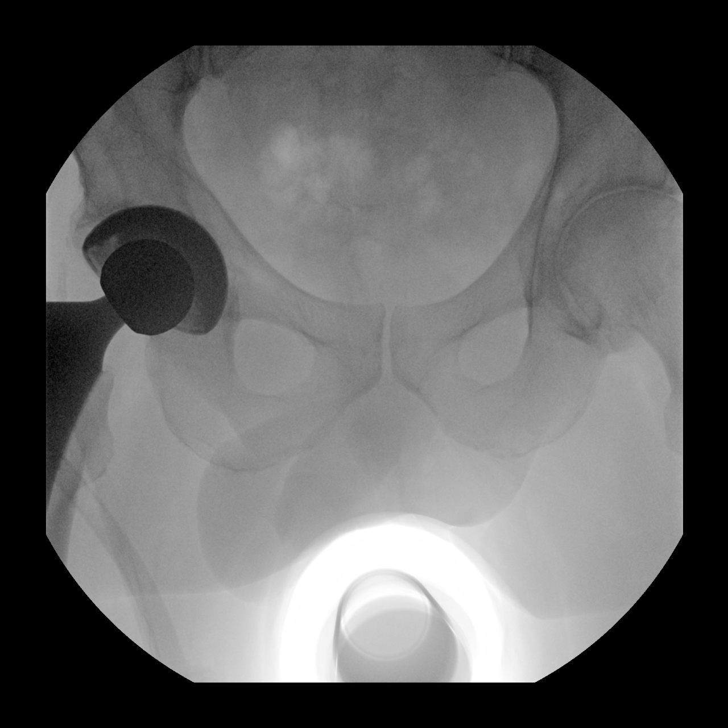
[im 3/9]
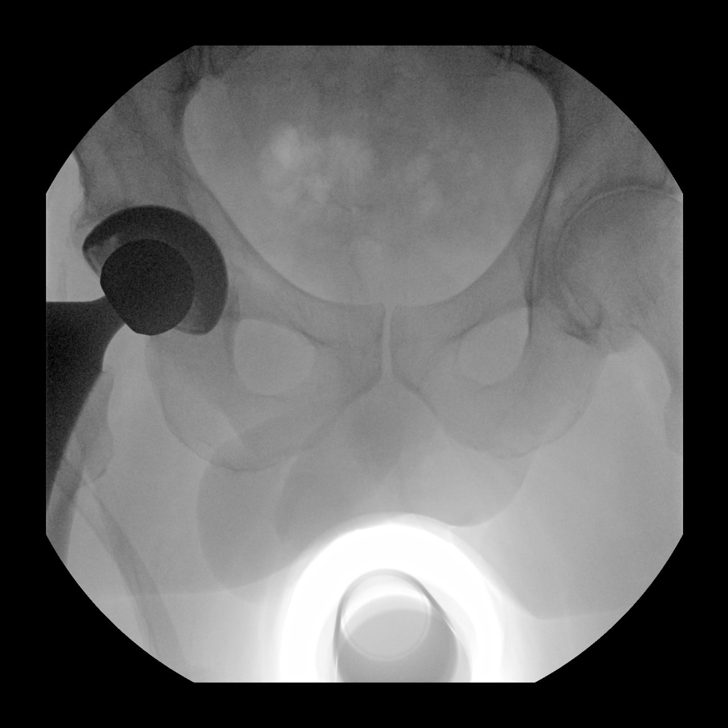
[im 4/9]
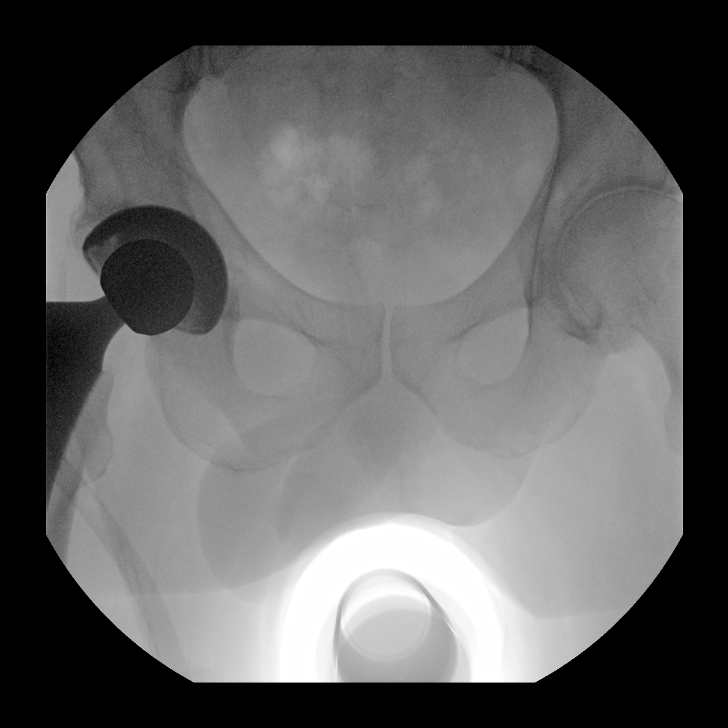
[im 5/9]
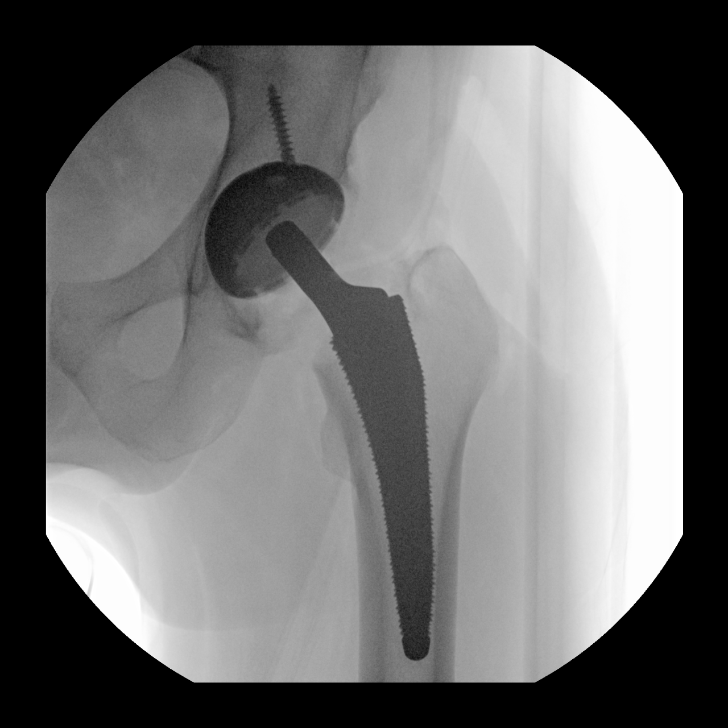
[im 6/9]
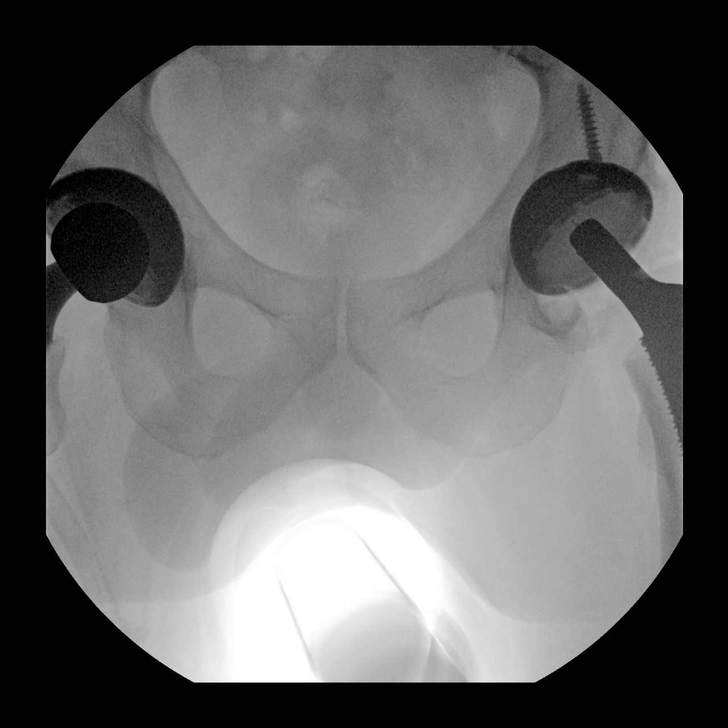
[im 7/9]
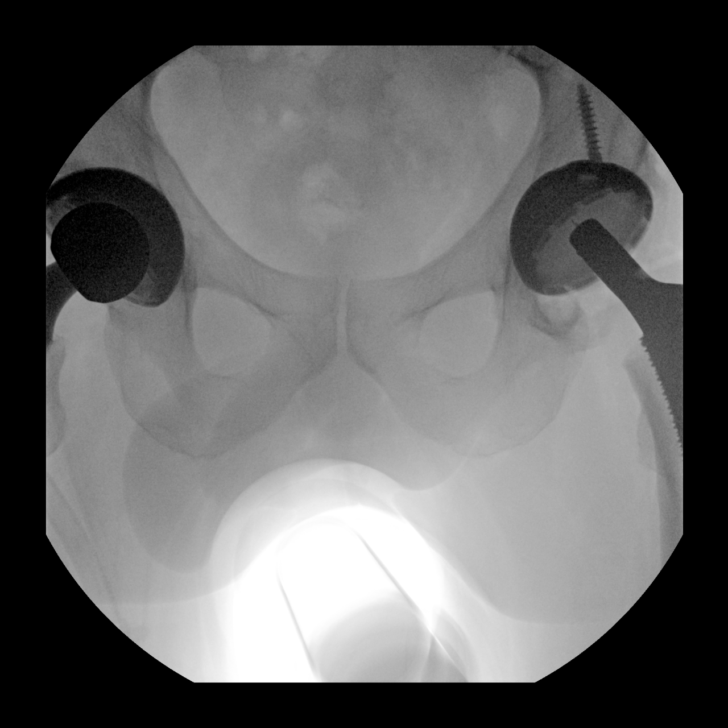
[im 8/9]
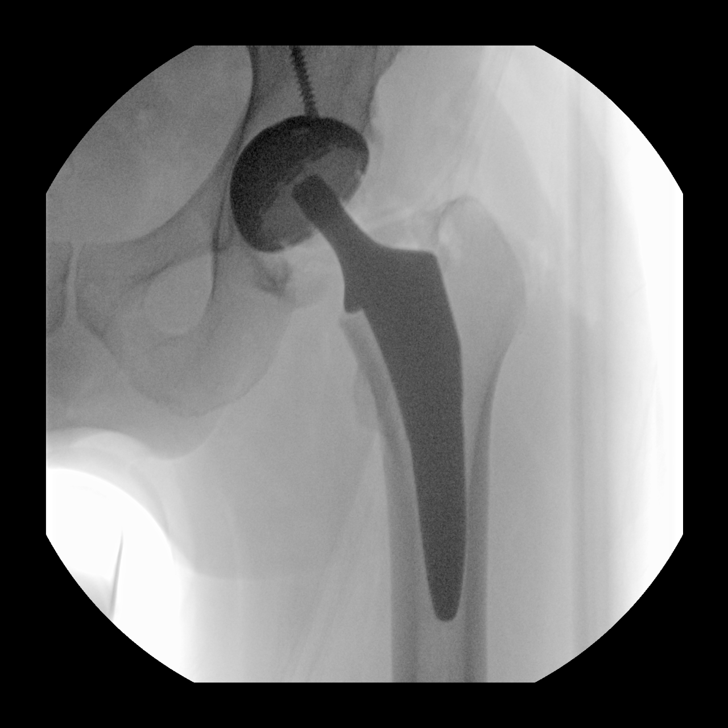
[im 9/9]
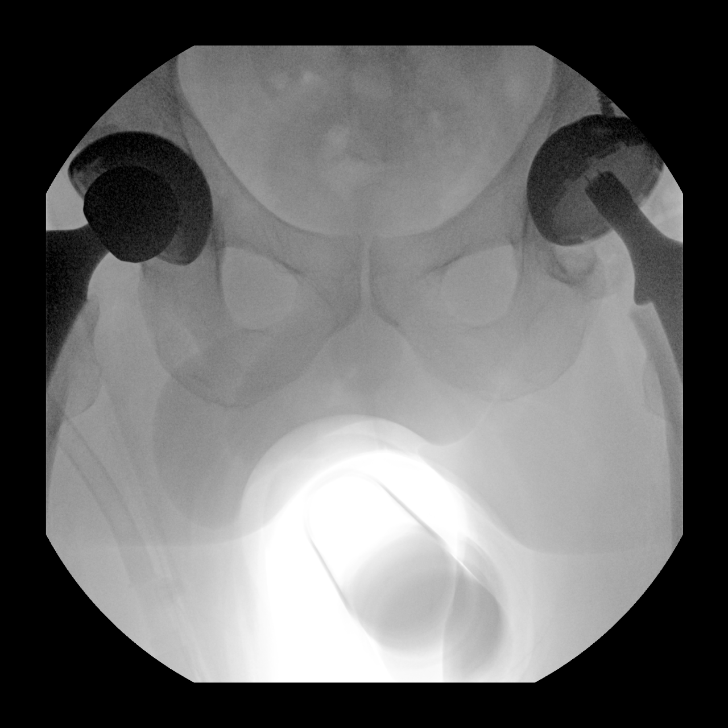

[9 of 9 positions shown; findings below may reference images not displayed]

FINDINGS: The patient has undergone total hip arthroplasty on the left. A
total of 9 images were obtained. The fluoroscopy time was 22
seconds. Final images demonstrate total hip arthroplasty on the left
with near anatomic alignment. The femoral head component was not yet
visualized. The patient is status post remote total hip arthroplasty
on the right.
IMPRESSION: Status post total hip arthroplasty on the left.

## 2020-08-15 IMAGING — DX PORTABLE PELVIS 1-2 VIEWS
1 series · 1 of 1 positions shown · non-contrast
Comparison: None.

CLINICAL DATA: Left hip replacement

EXAM:
PORTABLE PELVIS 1-2 VIEWS

[pelvis ap]
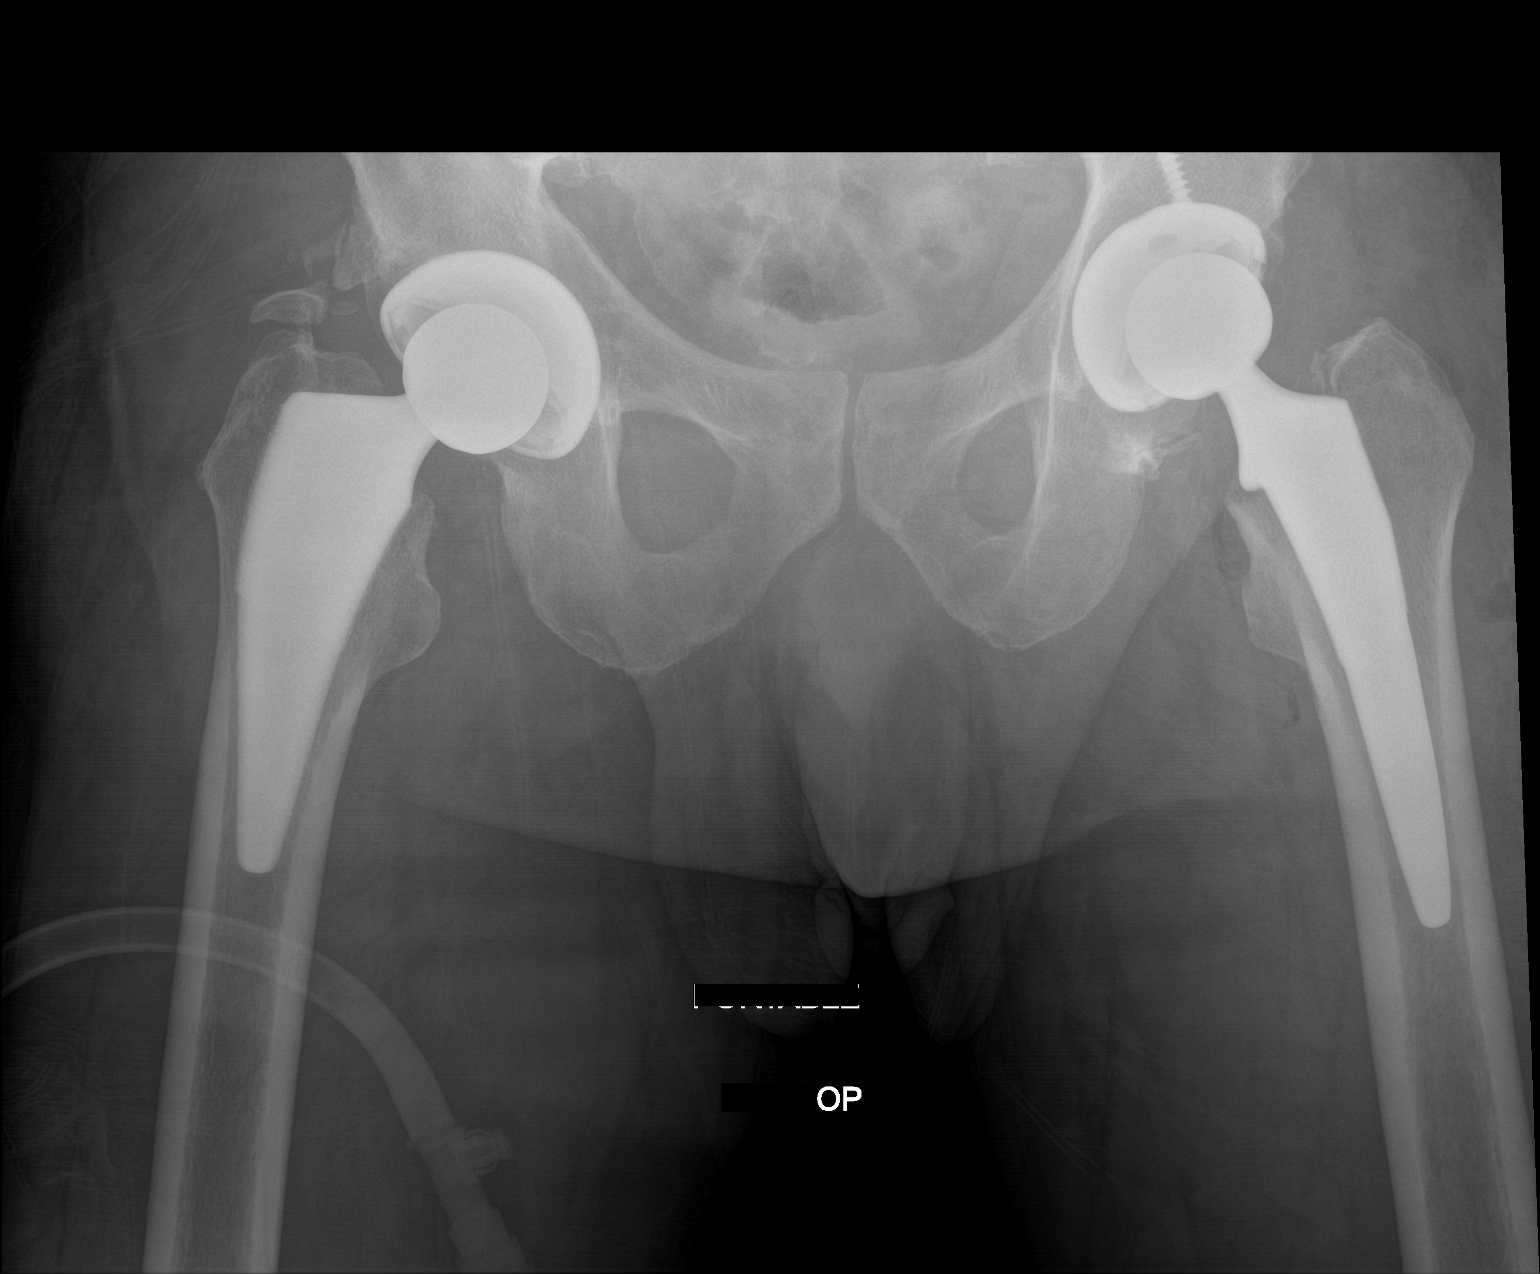

[1 of 1 positions shown; findings below may reference images not displayed]

FINDINGS: Interval left total hip arthroplasty. Normal alignment. No hardware
failure or complication. No acute fracture or dislocation.
Postsurgical changes in the surrounding soft tissues.

Prior right hip arthroplasty.
IMPRESSION: Interval left total hip arthroplasty.

## 2020-09-12 IMAGING — DX PORTABLE PELVIS 1-2 VIEWS
1 series · 1 of 1 positions shown · non-contrast
Comparison: One-view pelvis 01/26/2019

CLINICAL DATA: Status post hip replacement. Drainage from the left
hip. Head, ball, and liner exchange.

EXAM:
PORTABLE PELVIS 1-2 VIEWS

[pelvis ap]
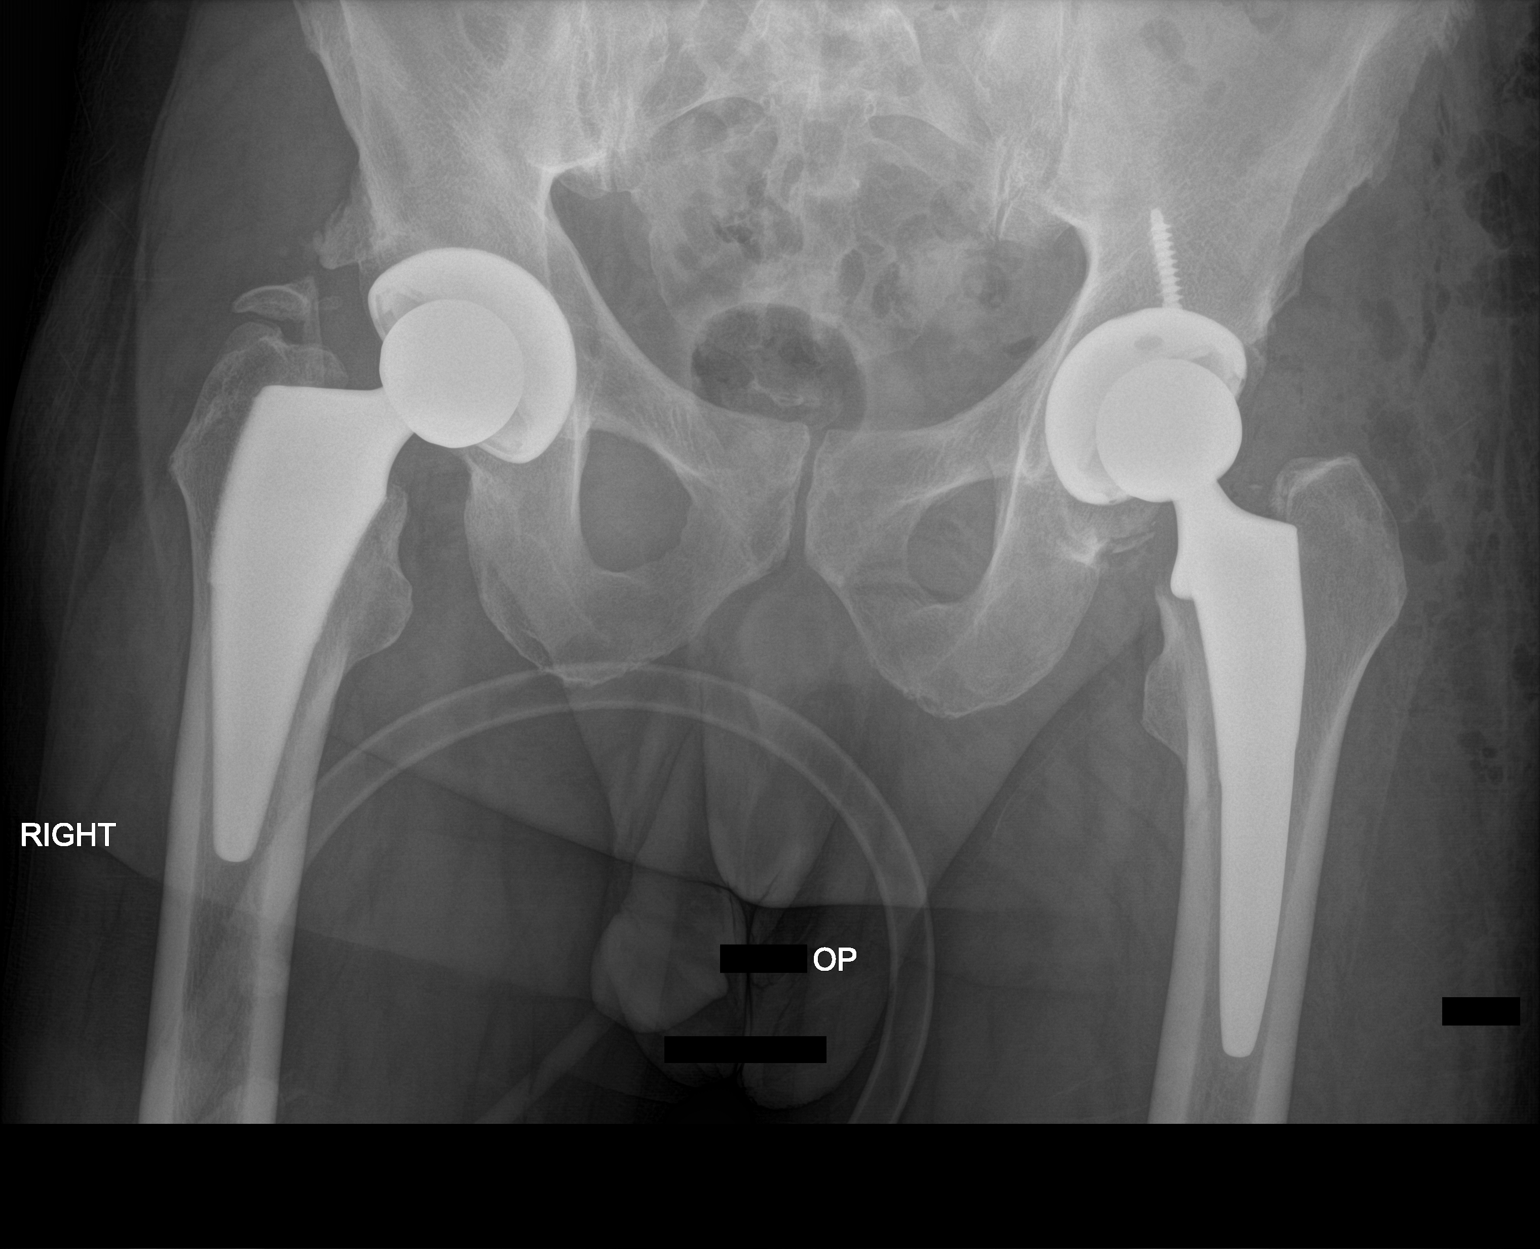

[1 of 1 positions shown; findings below may reference images not displayed]

FINDINGS: Bilateral total hip replacements are again noted. Foley catheter is
in place. No fractures are present. The hips appear located on this
AP view.
IMPRESSION: 1. Bilateral hip replacements. No radiographic evidence for
complication.
